# Patient Record
Sex: Male | Born: 1975 | Race: White | Hispanic: No | Smoking: Current every day smoker
Health system: Southern US, Community
[De-identification: ages and names within clinical notes are randomized; demographics above are authoritative.]

## PROBLEM LIST (undated history)

## (undated) DIAGNOSIS — J189 Pneumonia, unspecified organism: Secondary | ICD-10-CM

## (undated) DIAGNOSIS — K219 Gastro-esophageal reflux disease without esophagitis: Secondary | ICD-10-CM

## (undated) DIAGNOSIS — J449 Chronic obstructive pulmonary disease, unspecified: Secondary | ICD-10-CM

## (undated) DIAGNOSIS — S92909A Unspecified fracture of unspecified foot, initial encounter for closed fracture: Secondary | ICD-10-CM

## (undated) DIAGNOSIS — F419 Anxiety disorder, unspecified: Secondary | ICD-10-CM

## (undated) DIAGNOSIS — IMO0001 Reserved for inherently not codable concepts without codable children: Secondary | ICD-10-CM

## (undated) DIAGNOSIS — F329 Major depressive disorder, single episode, unspecified: Secondary | ICD-10-CM

## (undated) DIAGNOSIS — K859 Acute pancreatitis without necrosis or infection, unspecified: Secondary | ICD-10-CM

## (undated) DIAGNOSIS — F1020 Alcohol dependence, uncomplicated: Secondary | ICD-10-CM

## (undated) DIAGNOSIS — F32A Depression, unspecified: Secondary | ICD-10-CM

## (undated) DIAGNOSIS — I10 Essential (primary) hypertension: Secondary | ICD-10-CM

## (undated) DIAGNOSIS — S21339A Puncture wound without foreign body of unspecified front wall of thorax with penetration into thoracic cavity, initial encounter: Secondary | ICD-10-CM

## (undated) DIAGNOSIS — R0602 Shortness of breath: Secondary | ICD-10-CM

## (undated) DIAGNOSIS — R51 Headache: Secondary | ICD-10-CM

## (undated) DIAGNOSIS — M1990 Unspecified osteoarthritis, unspecified site: Secondary | ICD-10-CM

## (undated) DIAGNOSIS — W3400XA Accidental discharge from unspecified firearms or gun, initial encounter: Secondary | ICD-10-CM

## (undated) DIAGNOSIS — I209 Angina pectoris, unspecified: Secondary | ICD-10-CM

## (undated) HISTORY — PX: CORONARY ARTERY BYPASS GRAFT: SHX141

---
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
## 1999-08-28 ENCOUNTER — Emergency Department (HOSPITAL_COMMUNITY): Admission: EM | Admit: 1999-08-28 | Discharge: 1999-08-28 | Payer: Self-pay | Admitting: Emergency Medicine

## 1999-08-29 ENCOUNTER — Encounter: Payer: Self-pay | Admitting: Emergency Medicine

## 2000-07-19 DIAGNOSIS — S21339A Puncture wound without foreign body of unspecified front wall of thorax with penetration into thoracic cavity, initial encounter: Secondary | ICD-10-CM

## 2000-07-19 HISTORY — PX: MEDIASTERNOTOMY: SHX5084

## 2000-07-19 HISTORY — DX: Puncture wound without foreign body of unspecified front wall of thorax with penetration into thoracic cavity, initial encounter: S21.339A

## 2000-07-20 HISTORY — PX: AXILLARY SURGERY: SHX892

## 2000-12-24 HISTORY — PX: AMPUTATION FINGER / THUMB: SUR24

## 2001-06-05 ENCOUNTER — Emergency Department (HOSPITAL_COMMUNITY): Admission: EM | Admit: 2001-06-05 | Discharge: 2001-06-05 | Payer: Self-pay | Admitting: Emergency Medicine

## 2005-12-22 ENCOUNTER — Emergency Department (HOSPITAL_COMMUNITY): Admission: EM | Admit: 2005-12-22 | Discharge: 2005-12-23 | Payer: Self-pay | Admitting: Emergency Medicine

## 2006-07-18 ENCOUNTER — Emergency Department (HOSPITAL_COMMUNITY): Admission: EM | Admit: 2006-07-18 | Discharge: 2006-07-19 | Payer: Self-pay | Admitting: Emergency Medicine

## 2006-09-14 ENCOUNTER — Emergency Department (HOSPITAL_COMMUNITY): Admission: EM | Admit: 2006-09-14 | Discharge: 2006-09-14 | Payer: Self-pay | Admitting: Emergency Medicine

## 2006-09-16 ENCOUNTER — Emergency Department (HOSPITAL_COMMUNITY): Admission: EM | Admit: 2006-09-16 | Discharge: 2006-09-16 | Payer: Self-pay | Admitting: Emergency Medicine

## 2008-05-08 ENCOUNTER — Emergency Department (HOSPITAL_COMMUNITY): Admission: EM | Admit: 2008-05-08 | Discharge: 2008-05-09 | Payer: Self-pay | Admitting: Emergency Medicine

## 2008-08-18 ENCOUNTER — Emergency Department (HOSPITAL_COMMUNITY): Admission: EM | Admit: 2008-08-18 | Discharge: 2008-08-18 | Payer: Self-pay | Admitting: Emergency Medicine

## 2009-02-06 ENCOUNTER — Emergency Department (HOSPITAL_COMMUNITY): Admission: EM | Admit: 2009-02-06 | Discharge: 2009-02-07 | Payer: Self-pay | Admitting: Emergency Medicine

## 2010-01-08 ENCOUNTER — Emergency Department (HOSPITAL_COMMUNITY): Admission: EM | Admit: 2010-01-08 | Discharge: 2010-01-09 | Payer: Self-pay | Admitting: Emergency Medicine

## 2010-01-09 IMAGING — CT CT ANGIO CHEST
2 of 7 series · 19 of 36 positions shown · IV contrast (APPLIED)
Comparison: Chest radiograph performed [DATE]

CLINICAL DATA: Chest pain and shortness of breath; concern for
pulmonary embolus.

CT ANGIOGRAPHY CHEST WITH CONTRAST
TECHNIQUE: Multidetector CT imaging of the chest was performed
using the standard protocol during bolus administration of
intravenous contrast.  Multiplanar CT image reconstructions
including MIPs were obtained to evaluate the vascular anatomy.
Contrast:  100 mL of Omnipaque 300 IV contrast

[Series 8: pulm embolism 1.0 b25f thins · axial · 0.70mm/px · z∈[-149,+121]mm · 18 of 302 slices shown]
[im 16/302  lung]
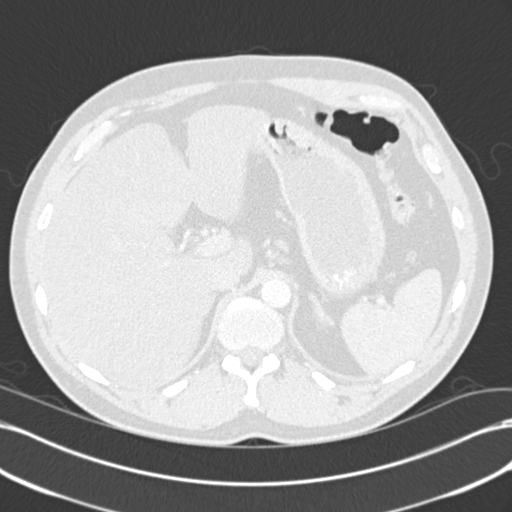
[im 32/302  mediastinal]
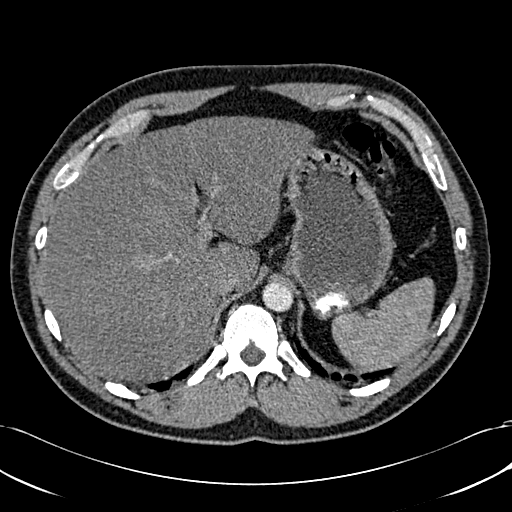
[im 48/302  lung]
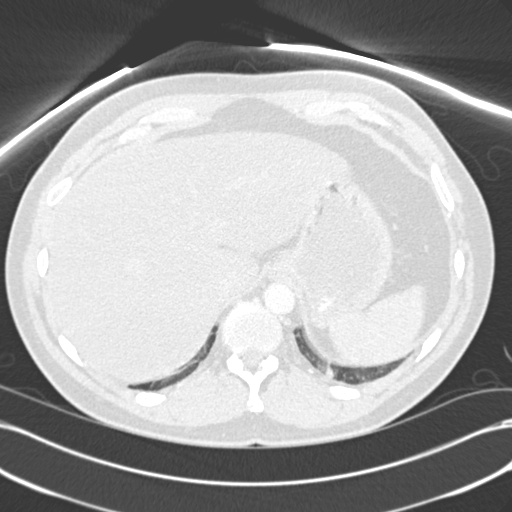
[im 64/302  mediastinal]
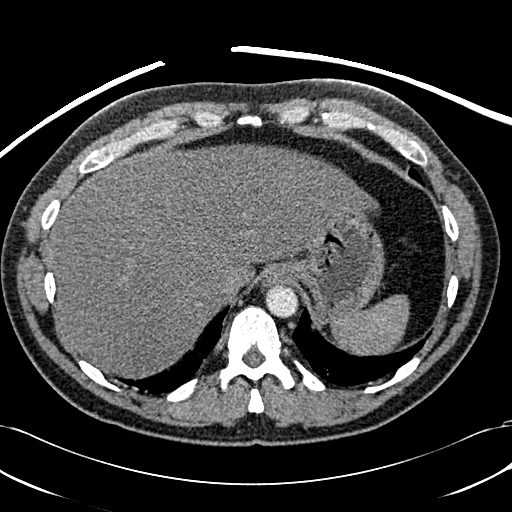
[im 80/302  lung]
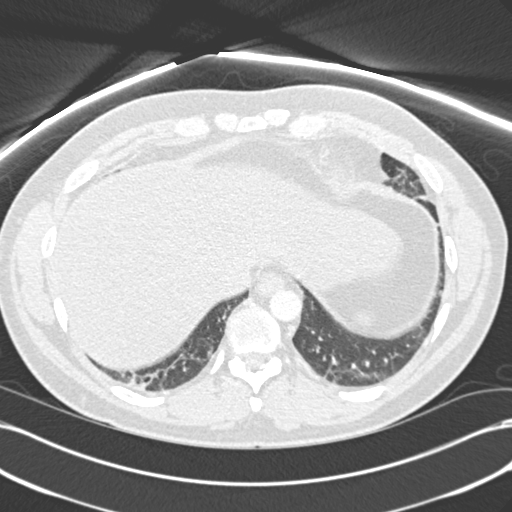
[im 96/302  mediastinal]
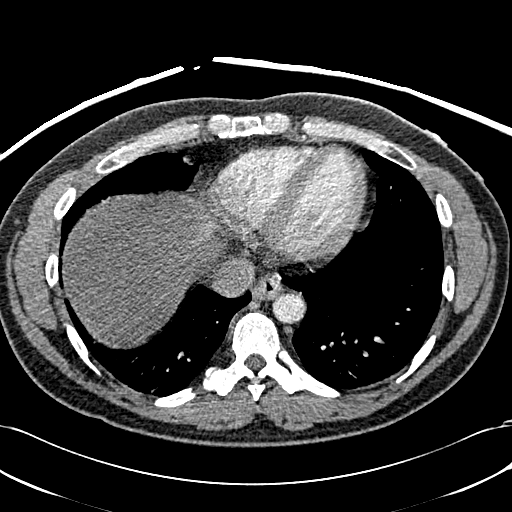
[im 111/302  lung]
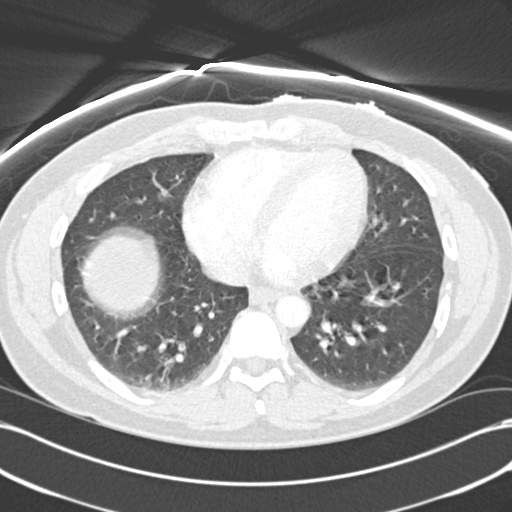
[im 127/302  mediastinal]
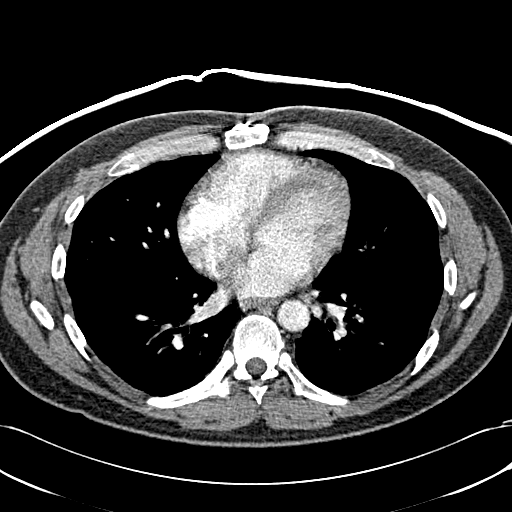
[im 143/302  lung]
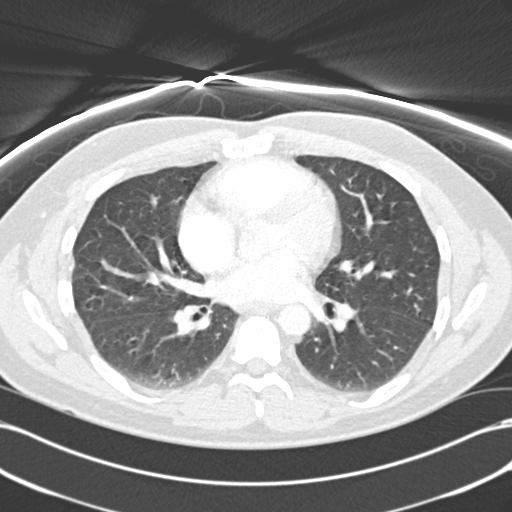
[im 159/302  mediastinal]
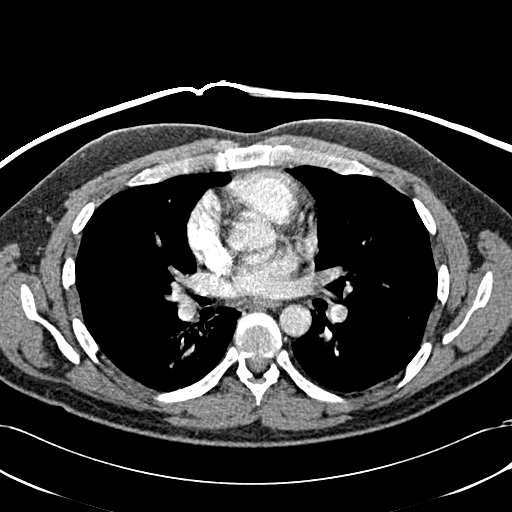
[im 175/302  lung]
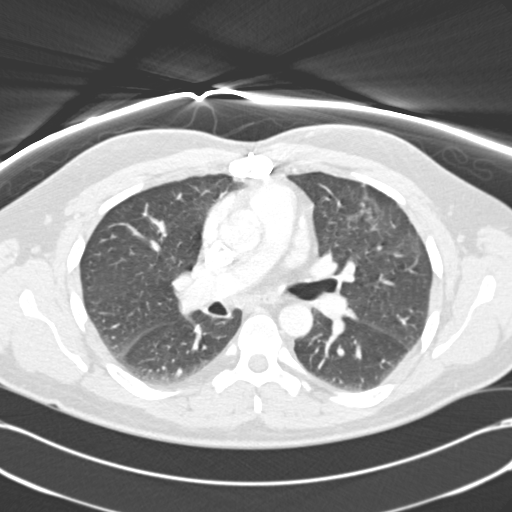
[im 191/302  mediastinal]
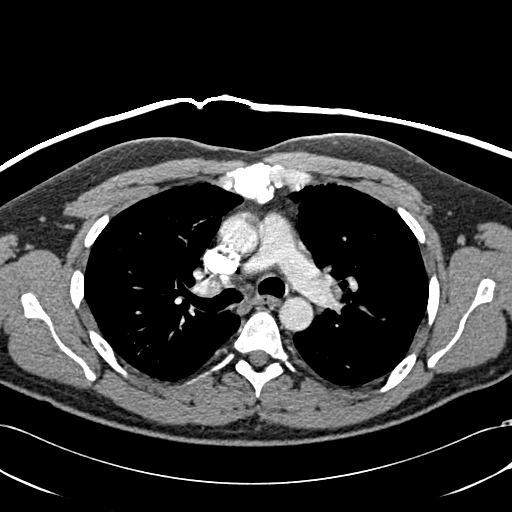
[im 206/302  lung]
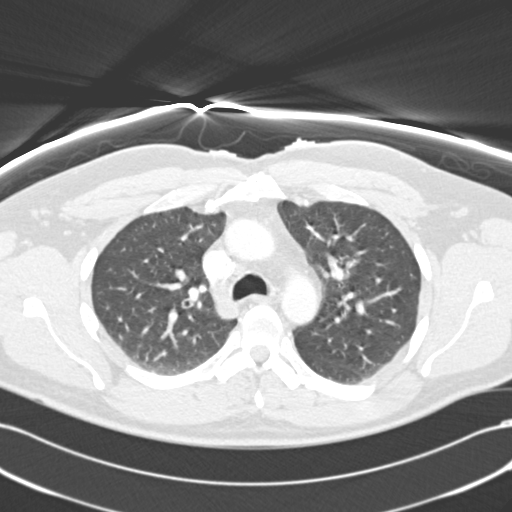
[im 222/302  mediastinal]
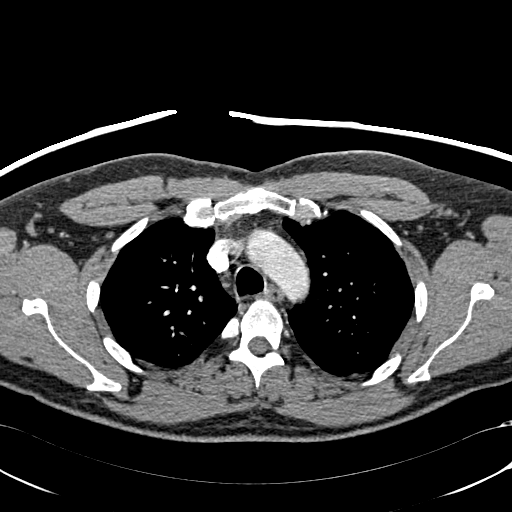
[im 238/302  lung]
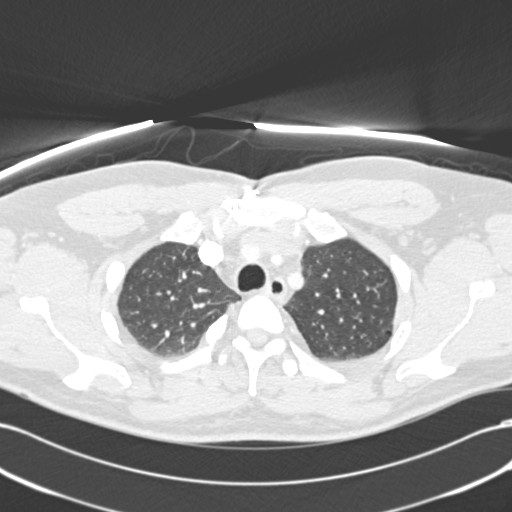
[im 254/302  mediastinal]
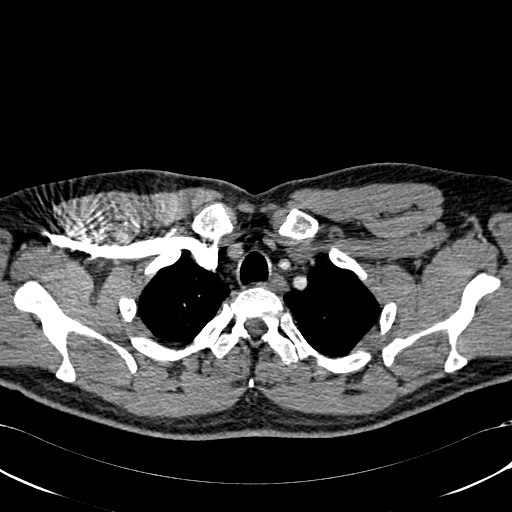
[im 270/302  lung]
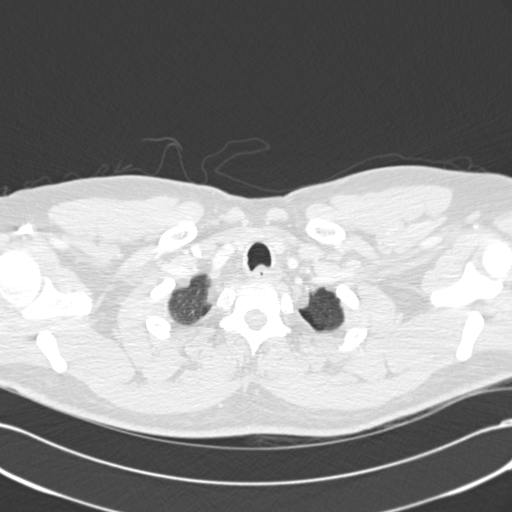
[im 286/302  mediastinal]
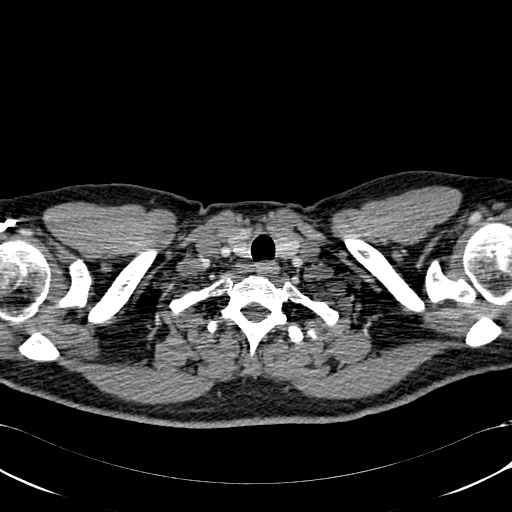

[Series 9: pulm embolism 2.0 spo thins · coronal · 0.66mm/px · 1 of 127 slices shown]
[im 64/127  mediastinal]
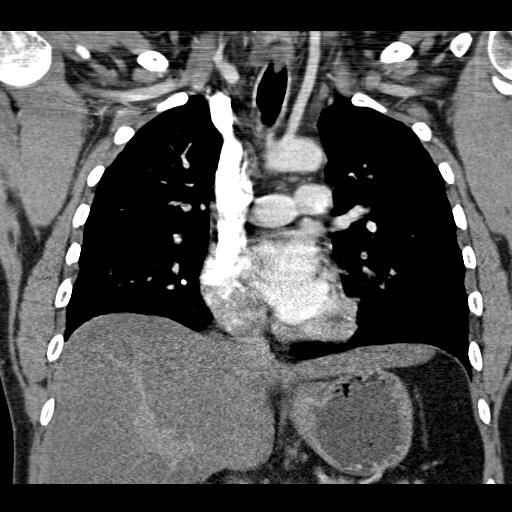

[19 of 36 positions shown; findings below may reference images not displayed]

FINDINGS: There is no evidence of pulmonary embolus, although
evaluation for pulmonary embolus is mildly suboptimal due to
limitations in the timing of the contrast bolus.

Minimal bibasilar atelectasis is noted.  There is no evidence of
significant focal consolidation, pleural effusion or pneumothorax.
No masses are identified; no abnormal focal contrast enhancement is
seen.

The mediastinum is unremarkable in appearance.  No mediastinal
lymphadenopathy is seen.  No pericardial effusion is identified.
The great vessels are within normal limits.  The patient is status
post median sternotomy.  No axillary lymphadenopathy is seen.  The
visualized portions of the thyroid gland are unremarkable in
appearance.

The visualized portions of the liver and spleen are unremarkable.

No acute osseous abnormalities are seen.

Review of the MIP images confirms the above findings.
IMPRESSION: 1.  No evidence of pulmonary embolus.
2.  Minimal bibasilar atelectasis noted.

## 2010-04-25 ENCOUNTER — Emergency Department (HOSPITAL_COMMUNITY): Admission: EM | Admit: 2010-04-25 | Discharge: 2010-04-25 | Payer: Self-pay | Admitting: Emergency Medicine

## 2010-07-23 ENCOUNTER — Inpatient Hospital Stay (HOSPITAL_COMMUNITY): Admission: EM | Admit: 2010-07-23 | Discharge: 2010-07-25 | Payer: Self-pay | Admitting: Emergency Medicine

## 2010-11-03 ENCOUNTER — Emergency Department (HOSPITAL_COMMUNITY): Admission: EM | Admit: 2010-11-03 | Discharge: 2010-11-04 | Payer: Self-pay | Admitting: Emergency Medicine

## 2011-03-06 LAB — RAPID URINE DRUG SCREEN, HOSP PERFORMED
Barbiturates: NOT DETECTED
Benzodiazepines: NOT DETECTED
Opiates: NOT DETECTED

## 2011-03-06 LAB — URINALYSIS, ROUTINE W REFLEX MICROSCOPIC
Bilirubin Urine: NEGATIVE
Glucose, UA: NEGATIVE mg/dL
Hgb urine dipstick: NEGATIVE
Ketones, ur: NEGATIVE mg/dL
Nitrite: NEGATIVE
Protein, ur: NEGATIVE mg/dL
Specific Gravity, Urine: 1.005 (ref 1.005–1.030)

## 2011-03-06 LAB — CBC
HCT: 45.5 % (ref 39.0–52.0)
Hemoglobin: 15.2 g/dL (ref 13.0–17.0)
WBC: 6.6 10*3/uL (ref 4.0–10.5)

## 2011-03-06 LAB — DIFFERENTIAL
Basophils Relative: 0 % (ref 0–1)
Eosinophils Absolute: 0.1 10*3/uL (ref 0.0–0.7)
Eosinophils Relative: 1 % (ref 0–5)
Lymphocytes Relative: 37 % (ref 12–46)
Monocytes Relative: 9 % (ref 3–12)
Neutro Abs: 3.4 10*3/uL (ref 1.7–7.7)

## 2011-03-06 LAB — COMPREHENSIVE METABOLIC PANEL
ALT: 96 U/L — ABNORMAL HIGH (ref 0–53)
AST: 72 U/L — ABNORMAL HIGH (ref 0–37)
Alkaline Phosphatase: 91 U/L (ref 39–117)
CO2: 23 mEq/L (ref 19–32)
Calcium: 8.6 mg/dL (ref 8.4–10.5)
GFR calc non Af Amer: >60 mL/min (ref >60)
Sodium: 139 mEq/L (ref 135–145)
Total Protein: 7.4 g/dL (ref 6.0–8.3)

## 2011-03-06 LAB — POCT CARDIAC MARKERS
CKMB, poc: <1 ng/mL — ABNORMAL LOW (ref 1.0–8.0)
CKMB, poc: <1 ng/mL — ABNORMAL LOW (ref 1.0–8.0)

## 2011-03-09 LAB — CBC
Hemoglobin: 15.9 g/dL (ref 13.0–17.0)
MCH: 35.4 pg — ABNORMAL HIGH (ref 26.0–34.0)
MCHC: 34.1 g/dL (ref 30.0–36.0)
MCV: 103.1 fL — ABNORMAL HIGH (ref 78.0–100.0)
Platelets: 113 10*3/uL — ABNORMAL LOW (ref 150–400)
RBC: 4.49 MIL/uL (ref 4.22–5.81)
RDW: 12.9 % (ref 11.5–15.5)
WBC: 5.5 10*3/uL (ref 4.0–10.5)

## 2011-03-09 LAB — COMPREHENSIVE METABOLIC PANEL
ALT: 69 U/L — ABNORMAL HIGH (ref 0–53)
AST: 57 U/L — ABNORMAL HIGH (ref 0–37)
BUN: 11 mg/dL (ref 6–23)
CO2: 27 mEq/L (ref 19–32)
Creatinine, Ser: 0.85 mg/dL (ref 0.4–1.5)
GFR calc non Af Amer: >60 mL/min (ref >60)
Glucose, Bld: 113 mg/dL — ABNORMAL HIGH (ref 70–99)
Sodium: 136 mEq/L (ref 135–145)
Total Bilirubin: 1 mg/dL (ref 0.3–1.2)
Total Protein: 6.7 g/dL (ref 6.0–8.3)

## 2011-03-09 LAB — VITAMIN B12: Vitamin B-12: 853 pg/mL (ref 211–911)

## 2011-03-09 LAB — LIPASE, BLOOD: Lipase: 53 U/L (ref 11–59)

## 2011-03-10 LAB — CULTURE, BLOOD (ROUTINE X 2)

## 2011-03-10 LAB — DIFFERENTIAL
Basophils Absolute: 0 10*3/uL (ref 0.0–0.1)
Eosinophils Absolute: 0.1 10*3/uL (ref 0.0–0.7)
Eosinophils Relative: 1 % (ref 0–5)
Lymphs Abs: 2.6 10*3/uL (ref 0.7–4.0)
Neutrophils Relative %: 45 % (ref 43–77)

## 2011-03-10 LAB — CK TOTAL AND CKMB (NOT AT ARMC)
CK, MB: 4.4 ng/mL — ABNORMAL HIGH (ref 0.3–4.0)
Relative Index: 1.8 (ref 0.0–2.5)
Total CK: 248 U/L — ABNORMAL HIGH (ref 7–232)

## 2011-03-10 LAB — BASIC METABOLIC PANEL
BUN: 8 mg/dL (ref 6–23)
Calcium: 8 mg/dL — ABNORMAL LOW (ref 8.4–10.5)
Chloride: 105 mEq/L (ref 96–112)
Creatinine, Ser: 0.88 mg/dL (ref 0.4–1.5)
GFR calc Af Amer: >60 mL/min (ref >60)

## 2011-03-10 LAB — CARDIAC PANEL(CRET KIN+CKTOT+MB+TROPI)
Relative Index: 2 (ref 0.0–2.5)
Total CK: 240 U/L — ABNORMAL HIGH (ref 7–232)
Troponin I: 0.01 ng/mL (ref 0.00–0.06)

## 2011-03-10 LAB — CBC
MCH: 35.8 pg — ABNORMAL HIGH (ref 26.0–34.0)
MCV: 104.5 fL — ABNORMAL HIGH (ref 78.0–100.0)
Platelets: 139 10*3/uL — ABNORMAL LOW (ref 150–400)
RBC: 4.47 MIL/uL (ref 4.22–5.81)
RDW: 13.7 % (ref 11.5–15.5)
WBC: 6.4 10*3/uL (ref 4.0–10.5)

## 2011-03-10 LAB — PHOSPHORUS: Phosphorus: 4.6 mg/dL (ref 2.3–4.6)

## 2011-03-10 LAB — LIPASE, BLOOD: Lipase: 283 U/L — ABNORMAL HIGH (ref 11–59)

## 2011-03-10 LAB — HEPATIC FUNCTION PANEL
Alkaline Phosphatase: 74 U/L (ref 39–117)
Bilirubin, Direct: <0.1 mg/dL (ref 0.0–0.3)
Total Bilirubin: 0.4 mg/dL (ref 0.3–1.2)

## 2011-03-10 LAB — TROPONIN I: Troponin I: 0.02 ng/mL (ref 0.00–0.06)

## 2011-03-10 LAB — MAGNESIUM: Magnesium: 2.2 mg/dL (ref 1.5–2.5)

## 2011-03-11 LAB — BASIC METABOLIC PANEL
CO2: 24 mEq/L (ref 19–32)
Chloride: 101 mEq/L (ref 96–112)
Creatinine, Ser: 0.87 mg/dL (ref 0.4–1.5)
GFR calc Af Amer: >60 mL/min (ref >60)
Potassium: 3.1 mEq/L — ABNORMAL LOW (ref 3.5–5.1)
Sodium: 138 mEq/L (ref 135–145)

## 2011-03-11 LAB — CBC
HCT: 46 % (ref 39.0–52.0)
Hemoglobin: 16.2 g/dL (ref 13.0–17.0)
MCV: 97.8 fL (ref 78.0–100.0)
RBC: 4.7 MIL/uL (ref 4.22–5.81)
WBC: 6.2 10*3/uL (ref 4.0–10.5)

## 2011-03-11 LAB — POCT CARDIAC MARKERS: Troponin i, poc: <0.05 ng/mL (ref 0.00–0.09)

## 2011-03-13 LAB — CBC
Hemoglobin: 16.7 g/dL (ref 13.0–17.0)
MCV: 102.9 fL — ABNORMAL HIGH (ref 78.0–100.0)
RBC: 4.55 MIL/uL (ref 4.22–5.81)
WBC: 4.2 10*3/uL (ref 4.0–10.5)

## 2011-03-13 LAB — DIFFERENTIAL
Eosinophils Absolute: 0.1 10*3/uL (ref 0.0–0.7)
Lymphs Abs: 1.5 10*3/uL (ref 0.7–4.0)
Monocytes Relative: 11 % (ref 3–12)
Neutro Abs: 2.2 10*3/uL (ref 1.7–7.7)
Neutrophils Relative %: 52 % (ref 43–77)

## 2011-03-13 LAB — RAPID URINE DRUG SCREEN, HOSP PERFORMED
Barbiturates: NOT DETECTED
Opiates: NOT DETECTED

## 2011-03-13 LAB — POCT CARDIAC MARKERS
CKMB, poc: 2 ng/mL (ref 1.0–8.0)
CKMB, poc: 2.1 ng/mL (ref 1.0–8.0)
Myoglobin, poc: 75.7 ng/mL (ref 12–200)
Myoglobin, poc: 95.2 ng/mL (ref 12–200)
Troponin i, poc: <0.05 ng/mL (ref 0.00–0.09)

## 2011-03-13 LAB — BASIC METABOLIC PANEL
Chloride: 107 mEq/L (ref 96–112)
Creatinine, Ser: 0.72 mg/dL (ref 0.4–1.5)
GFR calc Af Amer: >60 mL/min (ref >60)
Sodium: 141 mEq/L (ref 135–145)

## 2011-04-10 LAB — DIFFERENTIAL
Eosinophils Relative: 3 % (ref 0–5)
Lymphocytes Relative: 37 % (ref 12–46)
Lymphs Abs: 2.3 10*3/uL (ref 0.7–4.0)
Monocytes Relative: 11 % (ref 3–12)

## 2011-04-10 LAB — POCT CARDIAC MARKERS
CKMB, poc: <1 ng/mL — ABNORMAL LOW (ref 1.0–8.0)
Myoglobin, poc: 28.2 ng/mL (ref 12–200)
Myoglobin, poc: 32.8 ng/mL (ref 12–200)

## 2011-04-10 LAB — POCT I-STAT, CHEM 8
BUN: 16 mg/dL (ref 6–23)
Creatinine, Ser: 1.4 mg/dL (ref 0.4–1.5)
Glucose, Bld: 95 mg/dL (ref 70–99)
Potassium: 4 mEq/L (ref 3.5–5.1)
Sodium: 143 mEq/L (ref 135–145)
TCO2: 24 mmol/L (ref 0–100)

## 2011-04-10 LAB — BASIC METABOLIC PANEL
CO2: 25 mEq/L (ref 19–32)
Chloride: 105 mEq/L (ref 96–112)
GFR calc Af Amer: >60 mL/min (ref >60)
Sodium: 141 mEq/L (ref 135–145)

## 2011-04-10 LAB — CBC
RBC: 4.72 MIL/uL (ref 4.22–5.81)
WBC: 6.2 10*3/uL (ref 4.0–10.5)

## 2011-04-13 ENCOUNTER — Inpatient Hospital Stay (HOSPITAL_COMMUNITY)
Admission: EM | Admit: 2011-04-13 | Discharge: 2011-04-15 | DRG: 313 | Disposition: A | Discharge status: Home / Self Care | Payer: Medicaid Other | Attending: Internal Medicine | Admitting: Internal Medicine

## 2011-04-13 ENCOUNTER — Emergency Department (HOSPITAL_COMMUNITY): Payer: Medicaid Other

## 2011-04-13 DIAGNOSIS — F10239 Alcohol dependence with withdrawal, unspecified: Secondary | ICD-10-CM | POA: Diagnosis present

## 2011-04-13 DIAGNOSIS — R0789 Other chest pain: Principal | ICD-10-CM | POA: Diagnosis present

## 2011-04-13 DIAGNOSIS — I252 Old myocardial infarction: Secondary | ICD-10-CM

## 2011-04-13 DIAGNOSIS — F17200 Nicotine dependence, unspecified, uncomplicated: Secondary | ICD-10-CM | POA: Diagnosis present

## 2011-04-13 DIAGNOSIS — K861 Other chronic pancreatitis: Secondary | ICD-10-CM | POA: Diagnosis present

## 2011-04-13 DIAGNOSIS — Z7982 Long term (current) use of aspirin: Secondary | ICD-10-CM

## 2011-04-13 DIAGNOSIS — F10939 Alcohol use, unspecified with withdrawal, unspecified: Secondary | ICD-10-CM | POA: Diagnosis present

## 2011-04-13 DIAGNOSIS — J438 Other emphysema: Secondary | ICD-10-CM | POA: Diagnosis present

## 2011-04-13 DIAGNOSIS — I10 Essential (primary) hypertension: Secondary | ICD-10-CM | POA: Diagnosis present

## 2011-04-13 DIAGNOSIS — F10229 Alcohol dependence with intoxication, unspecified: Secondary | ICD-10-CM | POA: Diagnosis present

## 2011-04-13 DIAGNOSIS — Z8249 Family history of ischemic heart disease and other diseases of the circulatory system: Secondary | ICD-10-CM

## 2011-04-13 LAB — COMPREHENSIVE METABOLIC PANEL
Albumin: 3.7 g/dL (ref 3.5–5.2)
Alkaline Phosphatase: 96 U/L (ref 39–117)
BUN: 8 mg/dL (ref 6–23)
Chloride: 106 mEq/L (ref 96–112)
Glucose, Bld: 119 mg/dL — ABNORMAL HIGH (ref 70–99)
Potassium: 3.9 mEq/L (ref 3.5–5.1)
Total Bilirubin: 0.3 mg/dL (ref 0.3–1.2)

## 2011-04-13 LAB — CBC
MCH: 34.8 pg — ABNORMAL HIGH (ref 26.0–34.0)
MCHC: 35.1 g/dL (ref 30.0–36.0)
Platelets: 190 10*3/uL (ref 150–400)

## 2011-04-13 LAB — ETHANOL: Alcohol, Ethyl (B): 368 mg/dL — ABNORMAL HIGH (ref 0–10)

## 2011-04-13 LAB — DIFFERENTIAL
Basophils Relative: 1 % (ref 0–1)
Eosinophils Absolute: 0.1 10*3/uL (ref 0.0–0.7)
Eosinophils Relative: 1 % (ref 0–5)
Monocytes Absolute: 0.7 10*3/uL (ref 0.1–1.0)
Monocytes Relative: 10 % (ref 3–12)

## 2011-04-13 LAB — POCT CARDIAC MARKERS

## 2011-04-14 ENCOUNTER — Emergency Department (HOSPITAL_COMMUNITY): Payer: Medicaid Other

## 2011-04-14 LAB — DIFFERENTIAL
Eosinophils Absolute: 0.1 10*3/uL (ref 0.0–0.7)
Eosinophils Relative: 1 % (ref 0–5)
Lymphs Abs: 2.2 10*3/uL (ref 0.7–4.0)
Monocytes Absolute: 0.6 10*3/uL (ref 0.1–1.0)
Monocytes Relative: 9 % (ref 3–12)

## 2011-04-14 LAB — MRSA PCR SCREENING: MRSA by PCR: NEGATIVE

## 2011-04-14 LAB — CARDIAC PANEL(CRET KIN+CKTOT+MB+TROPI)
CK, MB: 3.1 ng/mL (ref 0.3–4.0)
Relative Index: 1.6 (ref 0.0–2.5)

## 2011-04-14 LAB — COMPREHENSIVE METABOLIC PANEL
ALT: 48 U/L (ref 0–53)
Albumin: 3.5 g/dL (ref 3.5–5.2)
Alkaline Phosphatase: 84 U/L (ref 39–117)
Calcium: 7.3 mg/dL — ABNORMAL LOW (ref 8.4–10.5)
GFR calc Af Amer: >60 mL/min (ref >60)
Potassium: 3.4 mEq/L — ABNORMAL LOW (ref 3.5–5.1)
Sodium: 139 mEq/L (ref 135–145)
Total Protein: 6.2 g/dL (ref 6.0–8.3)

## 2011-04-14 LAB — CBC
MCH: 33.9 pg (ref 26.0–34.0)
MCHC: 34.1 g/dL (ref 30.0–36.0)
MCV: 99.5 fL (ref 78.0–100.0)
Platelets: 161 10*3/uL (ref 150–400)
RDW: 13.2 % (ref 11.5–15.5)
WBC: 6.2 10*3/uL (ref 4.0–10.5)

## 2011-04-14 LAB — HEMOGLOBIN A1C
Hgb A1c MFr Bld: 5.7 % — ABNORMAL HIGH (ref <5.7)
Mean Plasma Glucose: 117 mg/dL — ABNORMAL HIGH (ref <117)

## 2011-04-14 LAB — RAPID URINE DRUG SCREEN, HOSP PERFORMED
Amphetamines: NOT DETECTED
Barbiturates: NOT DETECTED
Benzodiazepines: POSITIVE — AB
Cocaine: NOT DETECTED
Opiates: POSITIVE — AB
Tetrahydrocannabinol: NOT DETECTED

## 2011-04-14 LAB — LIPID PANEL
HDL: 42 mg/dL (ref >39)
Total CHOL/HDL Ratio: 3.5 RATIO
VLDL: 54 mg/dL — ABNORMAL HIGH (ref 0–40)

## 2011-04-15 LAB — CBC
HCT: 42.2 % (ref 39.0–52.0)
Hemoglobin: 14.2 g/dL (ref 13.0–17.0)
RBC: 4.28 MIL/uL (ref 4.22–5.81)
WBC: 5.9 10*3/uL (ref 4.0–10.5)

## 2011-04-15 LAB — COMPREHENSIVE METABOLIC PANEL
ALT: 40 U/L (ref 0–53)
AST: 39 U/L — ABNORMAL HIGH (ref 0–37)
Alkaline Phosphatase: 93 U/L (ref 39–117)
CO2: 27 mEq/L (ref 19–32)
Calcium: 8.1 mg/dL — ABNORMAL LOW (ref 8.4–10.5)
GFR calc Af Amer: >60 mL/min (ref >60)
GFR calc non Af Amer: >60 mL/min (ref >60)
Glucose, Bld: 87 mg/dL (ref 70–99)
Potassium: 3.7 mEq/L (ref 3.5–5.1)
Sodium: 133 mEq/L — ABNORMAL LOW (ref 135–145)
Total Protein: 6.1 g/dL (ref 6.0–8.3)

## 2011-04-15 LAB — MAGNESIUM: Magnesium: 1.7 mg/dL (ref 1.5–2.5)
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
## 2011-07-23 ENCOUNTER — Emergency Department (HOSPITAL_COMMUNITY): Payer: Medicaid Other

## 2011-07-23 ENCOUNTER — Emergency Department (HOSPITAL_COMMUNITY)
Admission: EM | Admit: 2011-07-23 | Discharge: 2011-07-23 | Discharge status: Against Medical Advice | Payer: Medicaid Other | Attending: Emergency Medicine | Admitting: Emergency Medicine

## 2011-07-23 DIAGNOSIS — R062 Wheezing: Secondary | ICD-10-CM | POA: Insufficient documentation

## 2011-07-23 DIAGNOSIS — Z79899 Other long term (current) drug therapy: Secondary | ICD-10-CM | POA: Insufficient documentation

## 2011-07-23 DIAGNOSIS — R000 Tachycardia, unspecified: Secondary | ICD-10-CM | POA: Insufficient documentation

## 2011-07-23 DIAGNOSIS — I252 Old myocardial infarction: Secondary | ICD-10-CM | POA: Insufficient documentation

## 2011-07-23 DIAGNOSIS — J438 Other emphysema: Secondary | ICD-10-CM | POA: Insufficient documentation

## 2011-07-23 DIAGNOSIS — I10 Essential (primary) hypertension: Secondary | ICD-10-CM | POA: Insufficient documentation

## 2011-07-23 DIAGNOSIS — R079 Chest pain, unspecified: Secondary | ICD-10-CM | POA: Insufficient documentation

## 2011-07-23 DIAGNOSIS — R0682 Tachypnea, not elsewhere classified: Secondary | ICD-10-CM | POA: Insufficient documentation

## 2011-07-23 DIAGNOSIS — R55 Syncope and collapse: Secondary | ICD-10-CM | POA: Insufficient documentation

## 2011-07-23 LAB — COMPREHENSIVE METABOLIC PANEL
ALT: 48 U/L (ref 0–53)
Alkaline Phosphatase: 94 U/L (ref 39–117)
BUN: 7 mg/dL (ref 6–23)
CO2: 26 mEq/L (ref 19–32)
Chloride: 102 mEq/L (ref 96–112)
GFR calc Af Amer: >60 mL/min (ref >60)
GFR calc non Af Amer: >60 mL/min (ref >60)
Glucose, Bld: 95 mg/dL (ref 70–99)
Potassium: 3.5 mEq/L (ref 3.5–5.1)
Sodium: 142 mEq/L (ref 135–145)
Total Bilirubin: 0.2 mg/dL — ABNORMAL LOW (ref 0.3–1.2)

## 2011-07-23 LAB — DIFFERENTIAL
Basophils Absolute: 0 10*3/uL (ref 0.0–0.1)
Eosinophils Relative: 2 % (ref 0–5)
Lymphocytes Relative: 33 % (ref 12–46)
Monocytes Absolute: 0.3 10*3/uL (ref 0.1–1.0)
Monocytes Relative: 6 % (ref 3–12)
Neutro Abs: 2.8 10*3/uL (ref 1.7–7.7)

## 2011-07-23 LAB — CBC
HCT: 45.4 % (ref 39.0–52.0)
Hemoglobin: 15.9 g/dL (ref 13.0–17.0)
MCH: 34.9 pg — ABNORMAL HIGH (ref 26.0–34.0)
MCHC: 35 g/dL (ref 30.0–36.0)
RDW: 12.7 % (ref 11.5–15.5)

## 2011-07-23 LAB — ETHANOL: Alcohol, Ethyl (B): 343 mg/dL — ABNORMAL HIGH (ref 0–11)

## 2011-09-06 ENCOUNTER — Emergency Department (HOSPITAL_COMMUNITY)
Admission: EM | Admit: 2011-09-06 | Discharge: 2011-09-07 | Disposition: A | Discharge status: Home / Self Care | Payer: Medicaid Other | Attending: Emergency Medicine | Admitting: Emergency Medicine

## 2011-09-06 DIAGNOSIS — M533 Sacrococcygeal disorders, not elsewhere classified: Secondary | ICD-10-CM | POA: Insufficient documentation

## 2011-09-06 DIAGNOSIS — R109 Unspecified abdominal pain: Secondary | ICD-10-CM | POA: Insufficient documentation

## 2011-09-06 DIAGNOSIS — R319 Hematuria, unspecified: Secondary | ICD-10-CM | POA: Insufficient documentation

## 2011-09-06 DIAGNOSIS — G8929 Other chronic pain: Secondary | ICD-10-CM | POA: Insufficient documentation

## 2011-09-06 DIAGNOSIS — I2510 Atherosclerotic heart disease of native coronary artery without angina pectoris: Secondary | ICD-10-CM | POA: Insufficient documentation

## 2011-09-06 DIAGNOSIS — R079 Chest pain, unspecified: Secondary | ICD-10-CM | POA: Insufficient documentation

## 2011-09-07 ENCOUNTER — Emergency Department (HOSPITAL_COMMUNITY): Payer: Medicaid Other

## 2011-09-07 LAB — BASIC METABOLIC PANEL
BUN: 6 mg/dL (ref 6–23)
Calcium: 8.8 mg/dL (ref 8.4–10.5)
Creatinine, Ser: 0.65 mg/dL (ref 0.50–1.35)
GFR calc non Af Amer: >60 mL/min (ref >60)
Glucose, Bld: 91 mg/dL (ref 70–99)
Potassium: 3.5 mEq/L (ref 3.5–5.1)

## 2011-09-07 LAB — CBC
Hemoglobin: 16.6 g/dL (ref 13.0–17.0)
MCH: 35.2 pg — ABNORMAL HIGH (ref 26.0–34.0)
MCHC: 35.3 g/dL (ref 30.0–36.0)
MCV: 99.6 fL (ref 78.0–100.0)

## 2011-09-07 LAB — URINALYSIS, ROUTINE W REFLEX MICROSCOPIC
Bilirubin Urine: NEGATIVE
Hgb urine dipstick: NEGATIVE
Ketones, ur: NEGATIVE mg/dL
Nitrite: NEGATIVE
Urobilinogen, UA: 0.2 mg/dL (ref 0.0–1.0)
pH: 6 (ref 5.0–8.0)

## 2011-09-07 LAB — DIFFERENTIAL
Basophils Relative: 0 % (ref 0–1)
Eosinophils Absolute: 0.1 10*3/uL (ref 0.0–0.7)
Eosinophils Relative: 2 % (ref 0–5)
Monocytes Relative: 13 % — ABNORMAL HIGH (ref 3–12)
Neutrophils Relative %: 55 % (ref 43–77)

## 2011-09-07 LAB — URINE MICROSCOPIC-ADD ON

## 2011-09-08 LAB — URINE CULTURE: Culture: NO GROWTH

## 2011-09-19 LAB — POCT CARDIAC MARKERS
CKMB, poc: 3.3
Myoglobin, poc: 208
Operator id: 294341
Troponin i, poc: <0.05

## 2012-03-02 ENCOUNTER — Inpatient Hospital Stay (HOSPITAL_COMMUNITY)
Admission: EM | Admit: 2012-03-02 | Discharge: 2012-03-04 | DRG: 439 | Discharge status: Against Medical Advice | Payer: Medicaid Other | Source: Ambulatory Visit | Attending: Internal Medicine | Admitting: Internal Medicine

## 2012-03-02 ENCOUNTER — Emergency Department (HOSPITAL_COMMUNITY): Payer: Medicaid Other

## 2012-03-02 ENCOUNTER — Other Ambulatory Visit: Payer: Self-pay

## 2012-03-02 ENCOUNTER — Encounter (HOSPITAL_COMMUNITY): Payer: Self-pay | Admitting: *Deleted

## 2012-03-02 DIAGNOSIS — F10229 Alcohol dependence with intoxication, unspecified: Secondary | ICD-10-CM | POA: Diagnosis present

## 2012-03-02 DIAGNOSIS — F10239 Alcohol dependence with withdrawal, unspecified: Secondary | ICD-10-CM | POA: Diagnosis present

## 2012-03-02 DIAGNOSIS — J209 Acute bronchitis, unspecified: Secondary | ICD-10-CM | POA: Diagnosis present

## 2012-03-02 DIAGNOSIS — K852 Alcohol induced acute pancreatitis without necrosis or infection: Secondary | ICD-10-CM | POA: Diagnosis present

## 2012-03-02 DIAGNOSIS — F10929 Alcohol use, unspecified with intoxication, unspecified: Secondary | ICD-10-CM | POA: Diagnosis present

## 2012-03-02 DIAGNOSIS — I10 Essential (primary) hypertension: Secondary | ICD-10-CM | POA: Diagnosis present

## 2012-03-02 DIAGNOSIS — R079 Chest pain, unspecified: Secondary | ICD-10-CM | POA: Diagnosis present

## 2012-03-02 DIAGNOSIS — F17200 Nicotine dependence, unspecified, uncomplicated: Secondary | ICD-10-CM | POA: Diagnosis present

## 2012-03-02 DIAGNOSIS — K700 Alcoholic fatty liver: Secondary | ICD-10-CM | POA: Diagnosis present

## 2012-03-02 DIAGNOSIS — K859 Acute pancreatitis without necrosis or infection, unspecified: Principal | ICD-10-CM | POA: Diagnosis present

## 2012-03-02 DIAGNOSIS — J440 Chronic obstructive pulmonary disease with acute lower respiratory infection: Secondary | ICD-10-CM | POA: Diagnosis present

## 2012-03-02 DIAGNOSIS — F10939 Alcohol use, unspecified with withdrawal, unspecified: Secondary | ICD-10-CM | POA: Diagnosis present

## 2012-03-02 DIAGNOSIS — E860 Dehydration: Secondary | ICD-10-CM | POA: Diagnosis present

## 2012-03-02 HISTORY — DX: Essential (primary) hypertension: I10

## 2012-03-02 LAB — DIFFERENTIAL
Basophils Absolute: 0 10*3/uL (ref 0.0–0.1)
Basophils Relative: 0 % (ref 0–1)
Basophils Relative: 0 % (ref 0–1)
Eosinophils Absolute: 0.1 10*3/uL (ref 0.0–0.7)
Eosinophils Absolute: 0 10*3/uL (ref 0.0–0.7)
Eosinophils Relative: 2 % (ref 0–5)
Lymphocytes Relative: 44 % (ref 12–46)
Lymphs Abs: 2 10*3/uL (ref 0.7–4.0)
Monocytes Absolute: 0.3 10*3/uL (ref 0.1–1.0)
Monocytes Relative: 7 % (ref 3–12)
Neutro Abs: 2.1 10*3/uL (ref 1.7–7.7)
Neutrophils Relative %: 47 % (ref 43–77)
Neutrophils Relative %: 67 % (ref 43–77)

## 2012-03-02 LAB — LIPASE, BLOOD: Lipase: 127 U/L — ABNORMAL HIGH (ref 11–59)

## 2012-03-02 LAB — CARDIAC PANEL(CRET KIN+CKTOT+MB+TROPI)
CK, MB: 4.2 ng/mL — ABNORMAL HIGH (ref 0.3–4.0)
Troponin I: <0.3 ng/mL (ref <0.30)
Troponin I: <0.3 ng/mL (ref <0.30)

## 2012-03-02 LAB — COMPREHENSIVE METABOLIC PANEL WITH GFR
ALT: 68 U/L — ABNORMAL HIGH (ref 0–53)
AST: 93 U/L — ABNORMAL HIGH (ref 0–37)
Albumin: 3.8 g/dL (ref 3.5–5.2)
Alkaline Phosphatase: 120 U/L — ABNORMAL HIGH (ref 39–117)
BUN: 6 mg/dL (ref 6–23)
CO2: 23 meq/L (ref 19–32)
Calcium: 8.3 mg/dL — ABNORMAL LOW (ref 8.4–10.5)
Chloride: 101 meq/L (ref 96–112)
Creatinine, Ser: 0.85 mg/dL (ref 0.50–1.35)
GFR calc Af Amer: >90 mL/min
GFR calc non Af Amer: >90 mL/min
Glucose, Bld: 98 mg/dL (ref 70–99)
Potassium: 3.7 meq/L (ref 3.5–5.1)
Sodium: 140 meq/L (ref 135–145)
Total Bilirubin: 0.3 mg/dL (ref 0.3–1.2)
Total Protein: 7.8 g/dL (ref 6.0–8.3)

## 2012-03-02 LAB — CBC
HCT: 48.1 % (ref 39.0–52.0)
HCT: 46.7 % (ref 39.0–52.0)
MCH: 33.8 pg (ref 26.0–34.0)
MCHC: 34.3 g/dL (ref 30.0–36.0)
MCV: 97 fL (ref 78.0–100.0)
RBC: 4.96 MIL/uL (ref 4.22–5.81)
RDW: 13.7 % (ref 11.5–15.5)
RDW: 13.7 % (ref 11.5–15.5)
WBC: 4.5 10*3/uL (ref 4.0–10.5)

## 2012-03-02 LAB — COMPREHENSIVE METABOLIC PANEL
CO2: 24 mEq/L (ref 19–32)
Calcium: 7.9 mg/dL — ABNORMAL LOW (ref 8.4–10.5)
Creatinine, Ser: 0.74 mg/dL (ref 0.50–1.35)
GFR calc Af Amer: >90 mL/min (ref >90)
GFR calc non Af Amer: >90 mL/min (ref >90)
Glucose, Bld: 91 mg/dL (ref 70–99)

## 2012-03-02 LAB — HEMOGLOBIN A1C
Hgb A1c MFr Bld: 5.6 % (ref <5.7)
Mean Plasma Glucose: 114 mg/dL (ref <117)

## 2012-03-02 LAB — APTT: aPTT: 31 seconds (ref 24–37)

## 2012-03-02 LAB — PHOSPHORUS: Phosphorus: 4.1 mg/dL (ref 2.3–4.6)

## 2012-03-02 LAB — RAPID URINE DRUG SCREEN, HOSP PERFORMED
Barbiturates: NOT DETECTED
Benzodiazepines: NOT DETECTED
Cocaine: NOT DETECTED
Opiates: NOT DETECTED
Tetrahydrocannabinol: NOT DETECTED

## 2012-03-02 LAB — MAGNESIUM: Magnesium: 1.8 mg/dL (ref 1.5–2.5)

## 2012-03-02 LAB — TROPONIN I: Troponin I: <0.3 ng/mL (ref <0.30)

## 2012-03-02 MED ORDER — LORAZEPAM 2 MG/ML IJ SOLN: 1.0000 mg | INTRAMUSCULAR | Status: DC

## 2012-03-02 MED ORDER — ONDANSETRON HCL 4 MG/2ML IJ SOLN
4.0000 mg | Freq: Once | INTRAMUSCULAR | Status: AC
  Administered 2012-03-02: 4 mg via INTRAVENOUS

## 2012-03-02 MED ORDER — PANTOPRAZOLE SODIUM 40 MG IV SOLR
40.0000 mg | INTRAVENOUS | Status: DC
  Administered 2012-03-02 – 2012-03-04 (×3): 40 mg via INTRAVENOUS
  Filled 2012-03-02 (×3): qty 40

## 2012-03-02 MED ORDER — LORAZEPAM 2 MG/ML IJ SOLN: 1.0000 mg | INTRAMUSCULAR | Status: DC | PRN

## 2012-03-02 MED ORDER — MORPHINE SULFATE 2 MG/ML IJ SOLN
1.0000 mg | INTRAMUSCULAR | Status: DC | PRN
  Administered 2012-03-02: 1 mg via INTRAVENOUS
  Filled 2012-03-02: qty 1

## 2012-03-02 MED ORDER — THIAMINE HCL 100 MG/ML IJ SOLN
100.0000 mg | Freq: Every day | INTRAMUSCULAR | Status: DC
  Administered 2012-03-03 – 2012-03-04 (×2): 100 mg via INTRAVENOUS
  Filled 2012-03-02 (×3): qty 1

## 2012-03-02 MED ORDER — SODIUM CHLORIDE 0.9 % IV SOLN
Freq: Once | INTRAVENOUS | Status: AC
  Administered 2012-03-02: 01:00:00 via INTRAVENOUS

## 2012-03-02 MED ORDER — LORAZEPAM 2 MG/ML IJ SOLN
INTRAMUSCULAR | Status: AC
  Filled 2012-03-02: qty 1

## 2012-03-02 MED ORDER — VITAMIN B-1 100 MG PO TABS
100.0000 mg | ORAL_TABLET | Freq: Every day | ORAL | Status: DC
  Administered 2012-03-02: 100 mg via ORAL
  Filled 2012-03-02 (×2): qty 1

## 2012-03-02 MED ORDER — HYDRALAZINE HCL 20 MG/ML IJ SOLN
10.0000 mg | Freq: Four times a day (QID) | INTRAMUSCULAR | Status: DC | PRN
  Filled 2012-03-02: qty 0.5

## 2012-03-02 MED ORDER — ONDANSETRON HCL 4 MG/2ML IJ SOLN
4.0000 mg | Freq: Four times a day (QID) | INTRAMUSCULAR | Status: DC | PRN
  Administered 2012-03-02: 4 mg via INTRAVENOUS
  Filled 2012-03-02: qty 2

## 2012-03-02 MED ORDER — SODIUM CHLORIDE 0.9 % IJ SOLN
3.0000 mL | Freq: Two times a day (BID) | INTRAMUSCULAR | Status: DC
  Administered 2012-03-02 – 2012-03-03 (×3): 3 mL via INTRAVENOUS

## 2012-03-02 MED ORDER — ALBUTEROL SULFATE (5 MG/ML) 0.5% IN NEBU
5.0000 mg | INHALATION_SOLUTION | Freq: Once | RESPIRATORY_TRACT | Status: AC
  Administered 2012-03-02: 5 mg via RESPIRATORY_TRACT
  Filled 2012-03-02: qty 1

## 2012-03-02 MED ORDER — ONDANSETRON HCL 4 MG PO TABS: 4.0000 mg | ORAL_TABLET | Freq: Four times a day (QID) | ORAL | Status: DC | PRN

## 2012-03-02 MED ORDER — HYDROMORPHONE HCL PF 1 MG/ML IJ SOLN
1.0000 mg | Freq: Once | INTRAMUSCULAR | Status: AC
  Administered 2012-03-02: 1 mg via INTRAVENOUS
  Filled 2012-03-02: qty 1

## 2012-03-02 MED ORDER — FOLIC ACID 1 MG PO TABS
1.0000 mg | ORAL_TABLET | Freq: Every day | ORAL | Status: DC
  Administered 2012-03-02: 1 mg via ORAL
  Filled 2012-03-02 (×2): qty 1

## 2012-03-02 MED ORDER — MORPHINE SULFATE 2 MG/ML IJ SOLN
2.0000 mg | Freq: Once | INTRAMUSCULAR | Status: AC
  Administered 2012-03-02: 2 mg via INTRAVENOUS
  Filled 2012-03-02: qty 1

## 2012-03-02 MED ORDER — ALBUTEROL SULFATE (5 MG/ML) 0.5% IN NEBU
INHALATION_SOLUTION | RESPIRATORY_TRACT | Status: AC
  Filled 2012-03-02: qty 1

## 2012-03-02 MED ORDER — ADULT MULTIVITAMIN W/MINERALS CH
1.0000 | ORAL_TABLET | Freq: Every day | ORAL | Status: DC
  Administered 2012-03-02 – 2012-03-04 (×3): 1 via ORAL
  Filled 2012-03-02 (×3): qty 1

## 2012-03-02 MED ORDER — LORAZEPAM 2 MG/ML IJ SOLN
INTRAMUSCULAR | Status: AC
  Administered 2012-03-02: 1 mg via INTRAVENOUS
  Filled 2012-03-02: qty 1

## 2012-03-02 MED ORDER — MORPHINE SULFATE 2 MG/ML IJ SOLN
1.0000 mg | Freq: Four times a day (QID) | INTRAMUSCULAR | Status: DC | PRN
  Administered 2012-03-02: 1 mg via INTRAVENOUS
  Filled 2012-03-02: qty 1

## 2012-03-02 MED ORDER — LORAZEPAM 2 MG/ML IJ SOLN
2.0000 mg | Freq: Once | INTRAMUSCULAR | Status: AC
  Administered 2012-03-02: 2 mg via INTRAVENOUS
  Filled 2012-03-02: qty 1

## 2012-03-02 MED ORDER — MORPHINE SULFATE 4 MG/ML IJ SOLN
2.0000 mg | Freq: Once | INTRAMUSCULAR | Status: AC
  Administered 2012-03-02: 2 mg via INTRAVENOUS
  Filled 2012-03-02: qty 1

## 2012-03-02 MED ORDER — SODIUM CHLORIDE 0.9 % IV SOLN
INTRAVENOUS | Status: DC
  Administered 2012-03-02: 125 mL/h via INTRAVENOUS
  Administered 2012-03-02 – 2012-03-03 (×2): via INTRAVENOUS

## 2012-03-02 MED ORDER — LORAZEPAM 2 MG/ML IJ SOLN
1.0000 mg | Freq: Four times a day (QID) | INTRAMUSCULAR | Status: DC | PRN
  Administered 2012-03-02 (×3): 1 mg via INTRAVENOUS
  Filled 2012-03-02: qty 1

## 2012-03-02 MED ORDER — ENOXAPARIN SODIUM 40 MG/0.4ML ~~LOC~~ SOLN
40.0000 mg | SUBCUTANEOUS | Status: DC
  Administered 2012-03-02: 40 mg via SUBCUTANEOUS
  Filled 2012-03-02 (×2): qty 0.4

## 2012-03-02 MED ORDER — ENOXAPARIN SODIUM 30 MG/0.3ML ~~LOC~~ SOLN: 30.0000 mg | SUBCUTANEOUS | Status: DC

## 2012-03-02 MED ORDER — LORAZEPAM 0.5 MG PO TABS: 1.0000 mg | ORAL_TABLET | Freq: Four times a day (QID) | ORAL | Status: DC | PRN

## 2012-03-02 MED ORDER — LORAZEPAM 2 MG/ML IJ SOLN
1.0000 mg | INTRAMUSCULAR | Status: DC | PRN
  Administered 2012-03-02 – 2012-03-03 (×3): 1 mg via INTRAVENOUS
  Filled 2012-03-02 (×4): qty 1

## 2012-03-02 MED ORDER — ALBUTEROL SULFATE (5 MG/ML) 0.5% IN NEBU: 2.5000 mg | INHALATION_SOLUTION | RESPIRATORY_TRACT | Status: DC | PRN

## 2012-03-02 MED ORDER — ONDANSETRON HCL 4 MG/2ML IJ SOLN
INTRAMUSCULAR | Status: AC
  Filled 2012-03-02: qty 2

## 2012-03-02 NOTE — ED Notes (Signed)
Report given to Tobi Bastos, RN on 2600. Patient being placed on Zoll and being transported to 2602.

## 2012-03-02 NOTE — ED Notes (Signed)
The patient states he started feeling chest tightness and shortness of breath at 0100 on 02/28/12.  He states the pain gets worse with movement.  The patient states he is a heavy drinker and that he hasn't had any alcohol since 1600 yesterday, so he feels like he is going through withdrawals.

## 2012-03-02 NOTE — H&P (Signed)
PCP:  [name], MD, MD   DOA:  03/02/2012 12:29 AM  Chief Complaint:  Alcohol intoxiacation  HPI: 36 year old male with past medical history of alcohol abuse, hypertension who presented to emergency department with complaints of retrosternal chest pain, 8/10 in intensity, constant, nonradiating, pressure like, started at rest. There was no associated cough or shortness of breath or palpitations. Patient reports abusing alcohol on daily basis. Last drink was one day prior to admission, vodka and beer. He reports feeling very restless and shaking. There are no reports of fever or chills. He does report having a periumbilical pain, 5-6/10, non radiating, burning, constant pain associated with nausea but no vomiting. No reports of blood in stool or urine. No complaints of diarrhea or constipation. No lightheadedness or dizziness.  Assessment/Plan  Principal Problem:   *Acute alcohol intoxication - alcohol level of 417 - start CIWA protocol - obtain UDS - monitor for withdrawals  Active Problems:   Pancreatitis, alcoholic, acute - continue IV fluids, NS @ 125 cc/hr - keep NPO - zofran Q $ hours PRN nausea and vomiting  Chest pain - rule out ACS/MI - the first set of troponins is negative - cycle cardiac enzymes for total of 3 sets - lipid panel, TSH and A1c follow up  Disposition - to stepdown for first 24 hours    Allergies: No Known Allergies  Prior to Admission medications   Medication Sig Start Date End Date Taking? Authorizing Provider  lisinopril (PRINIVIL,ZESTRIL) 20 MG tablet Take 20 mg by mouth daily.   Yes Historical Provider, MD    No past medical history on file.  No past surgical history on file.  Social History:  does not have a smoking history on file. He does not have any smokeless tobacco history on file. His alcohol and drug histories not on file.  No family history on file.  Review of Systems:  Constitutional: Denies fever, chills,  diaphoresis, appetite change and fatigue.  HEENT: Denies photophobia, eye pain, redness, hearing loss, ear pain, congestion, sore throat, rhinorrhea, sneezing, mouth sores, trouble swallowing, neck pain, neck stiffness and tinnitus.   Respiratory: Denies SOB, DOE, cough, chest tightness,  and wheezing.   Cardiovascular: (+) chest pain, (-) palpitations and leg swelling.  Gastrointestinal: (+) nausea, (-) vomiting, abdominal pain, diarrhea, constipation, blood in stool and abdominal distention.  Genitourinary: Denies dysuria, urgency, frequency, hematuria, flank pain and difficulty urinating.  Musculoskeletal: Denies myalgias, back pain, joint swelling, arthralgias and gait problem.  Skin: Denies pallor, rash and wound.  Neurological: as per HPI  Hematological: Denies adenopathy. Easy bruising, personal or family bleeding history  Psychiatric/Behavioral: Denies suicidal ideation, mood changes, confusion, nervousness, sleep disturbance and agitation   Physical Exam:  Filed Vitals:   03/02/12 0415 03/02/12 0500 03/02/12 0545 03/02/12 0642  BP: 137/84 145/81 137/91 150/90  Pulse: 100 109 111 103  Resp: 15 14 14 12   SpO2: 96% 90% 93% 95%    Constitutional: Vital signs reviewed.  Patient is cooperative with exam. Alert and oriented x3; restless Head: Normocephalic and atraumatic Ear: TM normal bilaterally Mouth: no erythema or exudates, MMM Eyes: PERRL, EOMI, conjunctivae normal, No scleral icterus.  Neck: Supple, Trachea midline normal ROM, No JVD, mass, thyromegaly, or carotid bruit present.  Cardiovascular: RRR, S1 normal, S2 normal, no MRG, pulses symmetric and intact bilaterally Pulmonary/Chest: CTAB, no wheezes, rales, or rhonchi Abdominal: Soft. Non-tender, non-distended, bowel sounds are normal, no masses, organomegaly, or guarding present.  GU: no CVA tenderness Musculoskeletal:  No joint deformities, erythema, or stiffness, ROM full and no nontender Ext: no edema and no cyanosis,  pulses palpable bilaterally (DP and PT) Hematology: no cervical, inginal, or axillary adenopathy.  Neurological: A&O x3, Strenght is normal and symmetric bilaterally, cranial nerve II-XII are grossly intact, no focal motor deficit, sensory intact to light touch bilaterally.  Skin: Warm, dry and intact. No rash, cyanosis, or clubbing.  Psychiatric: Normal mood and affect. speech and behavior is normal. J  Labs on Admission:  Results for orders placed during the hospital encounter of 03/02/12 (from the past 48 hour(s))  URINE RAPID DRUG SCREEN (HOSP PERFORMED)     Status: Normal   Collection Time   03/02/12  1:10 AM      Component Value Range Comment   Opiates NONE DETECTED  NONE DETECTED     Cocaine NONE DETECTED  NONE DETECTED     Benzodiazepines NONE DETECTED  NONE DETECTED     Amphetamines NONE DETECTED  NONE DETECTED     Tetrahydrocannabinol NONE DETECTED  NONE DETECTED     Barbiturates NONE DETECTED  NONE DETECTED    LIPASE, BLOOD     Status: Abnormal   Collection Time   03/02/12  1:25 AM      Component Value Range Comment   Lipase 127 (*) 11 - 59 (U/L)   COMPREHENSIVE METABOLIC PANEL     Status: Abnormal   Collection Time   03/02/12  1:25 AM      Component Value Range Comment   Sodium 140  135 - 145 (mEq/L)    Potassium 3.7  3.5 - 5.1 (mEq/L)    Chloride 101  96 - 112 (mEq/L)    CO2 23  19 - 32 (mEq/L)    Glucose, Bld 98  70 - 99 (mg/dL)    BUN 6  6 - 23 (mg/dL)    Creatinine, Ser 1.61  0.50 - 1.35 (mg/dL)    Calcium 8.3 (*) 8.4 - 10.5 (mg/dL)    Total Protein 7.8  6.0 - 8.3 (g/dL)    Albumin 3.8  3.5 - 5.2 (g/dL)    AST 93 (*) 0 - 37 (U/L)    ALT 68 (*) 0 - 53 (U/L)    Alkaline Phosphatase 120 (*) 39 - 117 (U/L)    Total Bilirubin 0.3  0.3 - 1.2 (mg/dL)    GFR calc non Af Amer >90  >90 (mL/min)    GFR calc Af Amer >90  >90 (mL/min)   CBC     Status: Abnormal   Collection Time   03/02/12  1:25 AM      Component Value Range Comment   WBC 4.5  4.0 - 10.5 (K/uL)    RBC  4.96  4.22 - 5.81 (MIL/uL)    Hemoglobin 17.3 (*) 13.0 - 17.0 (g/dL)    HCT 09.6  04.5 - 40.9 (%)    MCV 97.0  78.0 - 100.0 (fL)    MCH 34.9 (*) 26.0 - 34.0 (pg)    MCHC 36.0  30.0 - 36.0 (g/dL)    RDW 81.1  91.4 - 78.2 (%)    Platelets 128 (*) 150 - 400 (K/uL)   DIFFERENTIAL     Status: Normal   Collection Time   03/02/12  1:25 AM      Component Value Range Comment   Neutrophils Relative 47  43 - 77 (%)    Neutro Abs 2.1  1.7 - 7.7 (K/uL)    Lymphocytes Relative 44  12 - 46 (%)    Lymphs Abs 2.0  0.7 - 4.0 (K/uL)    Monocytes Relative 7  3 - 12 (%)    Monocytes Absolute 0.3  0.1 - 1.0 (K/uL)    Eosinophils Relative 2  0 - 5 (%)    Eosinophils Absolute 0.1  0.0 - 0.7 (K/uL)    Basophils Relative 0  0 - 1 (%)    Basophils Absolute 0.0  0.0 - 0.1 (K/uL)   TROPONIN I     Status: Normal   Collection Time   03/02/12  1:25 AM      Component Value Range Comment   Troponin I <0.30  <0.30 (ng/mL)   ETHANOL     Status: Abnormal   Collection Time   03/02/12  1:25 AM      Component Value Range Comment   Alcohol, Ethyl (B) 417 (*) 0 - 11 (mg/dL)     Time Spent on Admission: Greater than 30 minutes  Viola Kinnick 03/02/2012, 7:49 AM

## 2012-03-02 NOTE — ED Notes (Signed)
RT notified for tx

## 2012-03-02 NOTE — ED Notes (Addendum)
First meeting with patient. Patient remains on monitor and is beginning to get agitated. Patient states he had his last drink of alcohol at 4pm yesterday and he does not want to drink anymore. Patient denies chest pain and nausea.

## 2012-03-02 NOTE — ED Notes (Signed)
Patient sleeping wit NAD at this time.
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
## 2012-03-03 DIAGNOSIS — J209 Acute bronchitis, unspecified: Secondary | ICD-10-CM | POA: Diagnosis present

## 2012-03-03 DIAGNOSIS — F10239 Alcohol dependence with withdrawal, unspecified: Secondary | ICD-10-CM | POA: Diagnosis present

## 2012-03-03 DIAGNOSIS — E860 Dehydration: Secondary | ICD-10-CM | POA: Diagnosis present

## 2012-03-03 LAB — COMPREHENSIVE METABOLIC PANEL
ALT: 60 U/L — ABNORMAL HIGH (ref 0–53)
AST: 72 U/L — ABNORMAL HIGH (ref 0–37)
Albumin: 3.3 g/dL — ABNORMAL LOW (ref 3.5–5.2)
Alkaline Phosphatase: 105 U/L (ref 39–117)
BUN: 7 mg/dL (ref 6–23)
Chloride: 98 mEq/L (ref 96–112)
Potassium: 4.1 mEq/L (ref 3.5–5.1)
Total Bilirubin: 0.6 mg/dL (ref 0.3–1.2)

## 2012-03-03 LAB — CBC
MCH: 34.3 pg — ABNORMAL HIGH (ref 26.0–34.0)
MCHC: 34.5 g/dL (ref 30.0–36.0)
MCV: 99.6 fL (ref 78.0–100.0)
Platelets: 85 10*3/uL — ABNORMAL LOW (ref 150–400)
RDW: 13.7 % (ref 11.5–15.5)
WBC: 4 10*3/uL (ref 4.0–10.5)

## 2012-03-03 LAB — GLUCOSE, CAPILLARY: Glucose-Capillary: 176 mg/dL — ABNORMAL HIGH (ref 70–99)

## 2012-03-03 LAB — CARDIAC PANEL(CRET KIN+CKTOT+MB+TROPI): Troponin I: <0.3 ng/mL (ref <0.30)

## 2012-03-03 LAB — TSH: TSH: 4.06 u[IU]/mL (ref 0.350–4.500)

## 2012-03-03 MED ORDER — SODIUM CHLORIDE 0.9 % IV SOLN: 1.0000 mg | Freq: Every day | INTRAVENOUS | Status: DC

## 2012-03-03 MED ORDER — LEVOFLOXACIN IN D5W 750 MG/150ML IV SOLN
750.0000 mg | INTRAVENOUS | Status: DC
  Administered 2012-03-03 – 2012-03-04 (×2): 750 mg via INTRAVENOUS
  Filled 2012-03-03 (×2): qty 150

## 2012-03-03 MED ORDER — LEVALBUTEROL HCL 0.63 MG/3ML IN NEBU
0.6300 mg | INHALATION_SOLUTION | Freq: Four times a day (QID) | RESPIRATORY_TRACT | Status: DC
  Administered 2012-03-03 – 2012-03-04 (×4): 0.63 mg via RESPIRATORY_TRACT
  Filled 2012-03-03 (×8): qty 3

## 2012-03-03 MED ORDER — METHYLPREDNISOLONE SODIUM SUCC 125 MG IJ SOLR
125.0000 mg | Freq: Three times a day (TID) | INTRAMUSCULAR | Status: AC
  Administered 2012-03-03 – 2012-03-04 (×3): 125 mg via INTRAVENOUS
  Filled 2012-03-03 (×3): qty 2

## 2012-03-03 MED ORDER — KCL IN DEXTROSE-NACL 20-5-0.9 MEQ/L-%-% IV SOLN
INTRAVENOUS | Status: DC
  Administered 2012-03-03 – 2012-03-04 (×3): via INTRAVENOUS
  Filled 2012-03-03 (×5): qty 1000

## 2012-03-03 MED ORDER — LORAZEPAM 0.5 MG PO TABS: 1.0000 mg | ORAL_TABLET | ORAL | Status: DC | PRN

## 2012-03-03 MED ORDER — MORPHINE SULFATE 2 MG/ML IJ SOLN: 1.0000 mg | INTRAMUSCULAR | Status: DC | PRN

## 2012-03-03 MED ORDER — NICOTINE 21 MG/24HR TD PT24
21.0000 mg | MEDICATED_PATCH | TRANSDERMAL | Status: DC
  Administered 2012-03-03: 21 mg via TRANSDERMAL
  Filled 2012-03-03 (×2): qty 1

## 2012-03-03 MED ORDER — INSULIN ASPART 100 UNIT/ML ~~LOC~~ SOLN
0.0000 [IU] | Freq: Three times a day (TID) | SUBCUTANEOUS | Status: DC
  Administered 2012-03-03: 3 [IU] via SUBCUTANEOUS
  Administered 2012-03-04: 2 [IU] via SUBCUTANEOUS

## 2012-03-03 MED ORDER — FOLIC ACID 5 MG/ML IJ SOLN
1.0000 mg | Freq: Every day | INTRAMUSCULAR | Status: DC
  Administered 2012-03-03 – 2012-03-04 (×2): 1 mg via INTRAVENOUS
  Filled 2012-03-03 (×2): qty 0.2

## 2012-03-03 MED ORDER — LEVALBUTEROL HCL 0.63 MG/3ML IN NEBU
0.6300 mg | INHALATION_SOLUTION | RESPIRATORY_TRACT | Status: DC | PRN
  Filled 2012-03-03: qty 3

## 2012-03-03 MED ORDER — HYDRALAZINE HCL 20 MG/ML IJ SOLN
10.0000 mg | Freq: Four times a day (QID) | INTRAMUSCULAR | Status: DC | PRN
  Filled 2012-03-03: qty 0.5

## 2012-03-03 MED ORDER — LORAZEPAM 2 MG/ML IJ SOLN
1.0000 mg | INTRAMUSCULAR | Status: DC | PRN
  Administered 2012-03-03 – 2012-03-04 (×6): 1 mg via INTRAVENOUS
  Filled 2012-03-03 (×6): qty 1

## 2012-03-03 MED ORDER — PREDNISONE 50 MG PO TABS
60.0000 mg | ORAL_TABLET | Freq: Every day | ORAL | Status: DC
  Filled 2012-03-03 (×2): qty 1

## 2012-03-03 MED ORDER — GUAIFENESIN ER 600 MG PO TB12
1200.0000 mg | ORAL_TABLET | Freq: Two times a day (BID) | ORAL | Status: DC | PRN
  Filled 2012-03-03: qty 2

## 2012-03-03 NOTE — Progress Notes (Signed)
Clinical Child psychotherapist discussed with pt MD regarding pt current substance abuse. Per discussion, MD advised waiting to discuss further substance abuse treatment options with patient once pt is not experiencing active dt's. .Clinical social worker continuing to follow pt to assist with pt dc plans and further csw needs.   Catha Gosselin, Theresia Majors  [phone] .03/03/2012 14:23pm

## 2012-03-03 NOTE — Progress Notes (Signed)
[facility]  Subjective: Endorses thirst but nearly resolved abdominal pain. Endorsed had cough before admission. MD discussed with patient need to stop ETOH then tobacco. Patient verbalizes understanding of rationale for no solid food. Endorses "shakiness."  Objective: Vital signs in last 24 hours: Temp:  [97.6 F (36.4 C)-98.9 F (37.2 C)] 98.1 F (36.7 C) (03/11 1100) Pulse Rate:  [77-111] 77  (03/11 1100) Resp:  [12-20] 12  (03/11 1100) BP: (129-169)/(82-124) 136/90 mmHg (03/11 1100) SpO2:  [92 %-97 %] 97 % (03/11 1100) FiO2 (%):  [3 %] 3 % (03/10 1600) Weight:  [95.1 kg (209 lb 10.5 oz)] 95.1 kg (209 lb 10.5 oz) (03/11 0500) Weight change: -33.307 kg (-73 lb 6.9 oz) Last BM Date: 03/01/12  Intake/Output from previous day: 03/10 0701 - 03/11 0700 In: 917.6 [I.V.:917.6] Out: 2050 [Urine:1825; Emesis/NG output:225] Intake/Output this shift: Total I/O In: 525 [I.V.:375; IV Piggyback:150] Out: 700 [Urine:700]  General appearance: alert, cooperative, appears stated age and mild distress Resp: expiratory wheezes bilaterally, 2 liters oxygen, productive cough observed Cardio: regular rate and rhythm, S1, S2 normal, no murmur, click, rub or gallop GI: soft, non-tender; bowel sounds normal; no masses,  no organomegaly Extremities: extremities normal, atraumatic, no cyanosis or edema Neurologic: Grossly normal, no tremors observed, no psycho-motor agitation  Lab Results:  [facility] 03/03/12 0204 03/02/12 0950  WBC 4.0 5.1  HGB 15.4 16.0  HCT 44.7 46.7  PLT 85* 112*   BMET  Basename 03/03/12 0204 03/02/12 0950  NA 136 140  K 4.1 3.6  CL 98 102  CO2 26 24  GLUCOSE 69* 91  BUN 7 7  CREATININE 0.69 0.74  CALCIUM 8.7 7.9*    Studies/Results: Dg Chest Port 1 View  03/02/2012  *RADIOLOGY REPORT*  Clinical Data: Chest pain, EtOH  PORTABLE CHEST - 1 VIEW  Comparison: 09/07/2011  Findings: Heart size within normal limits.  Status post median sternotomy.  Blunted right  costophrenic angle may reflect scarring or a trace pleural effusion.  No pneumothorax.  No new areas of consolidation.  No acute osseous abnormality.  IMPRESSION: No focal consolidation.  Original Report Authenticated By: Waneta Martins, M.D.   Medications:  I have reviewed the patient's current medications.  Assessment/Plan:  Acute alcohol intoxication/ Alcohol withdrawal syndrome * Blood alcohol level >400 at admission * Continue CIWA protocol q 4 hours   Chest pain * No further chest pain * Enzymes and EKG negative * sx are actually more c/w epigastric pain   Pancreatitis, alcoholic, acute *Lipase mildly elevated- repeat in am *CT abdomen/pelvis shows no evidence severe pancreatitis or pseudocyst *Allow clears only  Bronchitis, acute, with bronchospasm * Begin Levaquin for short course * Add nebs and IV steroids on rapid taper   Dehydration * Continue IVF but add dextrose for hypoglycemia   HTN (hypertension) * Adjust IV hydralazine  Tobacco abuse * Nicotine patch   Disposition * Remain in stepdown   LOS: 1 day   Junious Silk, ANP pager [phone]  [facility] 1 [email or web address] Password: [email or web address]  03/03/2012, 1:04 PM  Lonia Blood, MD [facility] Office  [phone] Pager [phone]  On-Call/Text Page:      [email or web address]      password [facility]

## 2012-03-04 LAB — GLUCOSE, CAPILLARY
Glucose-Capillary: 165 mg/dL — ABNORMAL HIGH (ref 70–99)
Glucose-Capillary: 168 mg/dL — ABNORMAL HIGH (ref 70–99)
Glucose-Capillary: 195 mg/dL — ABNORMAL HIGH (ref 70–99)

## 2012-03-04 LAB — COMPREHENSIVE METABOLIC PANEL
ALT: 53 U/L (ref 0–53)
AST: 57 U/L — ABNORMAL HIGH (ref 0–37)
Alkaline Phosphatase: 111 U/L (ref 39–117)
CO2: 24 mEq/L (ref 19–32)
Calcium: 9.2 mg/dL (ref 8.4–10.5)
Chloride: 97 mEq/L (ref 96–112)
GFR calc non Af Amer: >90 mL/min (ref >90)
Potassium: 4.1 mEq/L (ref 3.5–5.1)
Sodium: 130 mEq/L — ABNORMAL LOW (ref 135–145)
Total Bilirubin: 0.4 mg/dL (ref 0.3–1.2)

## 2012-03-04 LAB — MAGNESIUM: Magnesium: 1.9 mg/dL (ref 1.5–2.5)

## 2012-03-04 LAB — CBC
Hemoglobin: 16.7 g/dL (ref 13.0–17.0)
MCH: 34.2 pg — ABNORMAL HIGH (ref 26.0–34.0)
MCHC: 35.2 g/dL (ref 30.0–36.0)

## 2012-03-04 MED ORDER — POTASSIUM CHLORIDE 20 MEQ/15ML (10%) PO LIQD
40.0000 meq | Freq: Once | ORAL | Status: DC
  Filled 2012-03-04: qty 30

## 2012-03-04 NOTE — Progress Notes (Signed)
Clinical social worker received referral for current substance abuse. However pt discharged before csw could assess patient. .No further Clinical Social Work needs, signing off.   Catha Gosselin, Theresia Majors  [phone] .03/04/2012 16:12pm

## 2012-03-04 NOTE — Discharge Summary (Signed)
DISCHARGE SUMMARY  DAVIEL Sutton  MR#: [id number]  DOB:06/07/1976  Date of Admission: 03/02/2012 Date of Discharge: 03/04/2012  Attending Physician:Jeovanny Cuadros  Patient's Ruben Sutton:WJXBJYNW,GNFAOZ M, MD, MD  The patient has left AMA  Presenting Complaint: Chest pain  Discharge Diagnoses: Chest pain- likely GI related  Acute alcohol intoxication and Alcohol withdrawal syndrome Acute Pancreatitis due to ETOH abuse  Fatty liver per CT scan  HTN (hypertension)  Bronchitis, acute, with bronchospasm  Dehydration   Discharge Medications: Did not address as patient left AMA   Procedures: Dg Chest Port 1 View  03/02/2012  *RADIOLOGY REPORT*  Clinical Data: Chest pain, EtOH  PORTABLE CHEST - 1 VIEW  Comparison: 09/07/2011  Findings: Heart size within normal limits.  Status post median sternotomy.  Blunted right costophrenic angle may reflect scarring or a trace pleural effusion.  No pneumothorax.  No new areas of consolidation.  No acute osseous abnormality.  IMPRESSION: No focal consolidation.  Original Report Authenticated By: Waneta Martins, M.D.     Hospital Course:  This is a 36 y/o male who presented to the ER with retrosternal chest pain 8/10 and periumbilical pain 5-6/10 and nausea. He abuses alcohol and had an alcohol level of 417 on admission. He was admitted and 3 sets of cardiac enzymes were checked and found to be negative. His EKG did not reveal any changes consistent with acute coronary syndrome. His pain is suspected to be related to acute pancreatitis; lipase was 127 on admission and has improved to 57 today.  His chest and abdominal pain has resolved with bowel rest. I advanced his diet today but he has left prior to receiving it.  He was also treated for acute Bronchitis and a COPD exacerbation with antibiotics and steroids.  He is undergoing ETOH withdrawal and endorses symptoms of "shakiness". He is not confused or having hallucinations. He had a headache earlier  today but this has resolved. He is insisting on leaving since he is feeling better and states that he will not drink again. I have spoken with him and strongly advised him to stay to be further treated for alcohol withdrawal but he states he will see his PCP tomorrow for this and for the bronchitis. I have explained that the withdrawal symptoms will get worse and he may have seizures as a result. He is still adamant about leaving and retuning to the ER if he feels worse.  He has been strongly advised to stop drinking and is in agreement to do so.  His dehydration has resolved with IVF.    Day of Discharge Physical Exam: BP 168/107  Pulse 89  Temp(Src) 97.5 F (36.4 C) (Oral)  Resp 19  Ht 5\' 9"  (1.753 m)  Wt 95.1 kg (209 lb 10.5 oz)  BMI 30.96 kg/m2  SpO2 97% General appearance: alert, cooperative, appears stated age and mild distress  Resp: CTA b/l no respiratory distress Cardio: regular rate and rhythm, S1, S2 normal, no murmur, click, rub or gallop  GI: soft, non-tender; bowel sounds normal; no masses, no organomegaly  Extremities: extremities normal, atraumatic, no cyanosis or edema  Neurologic: Grossly normal, no tremors observed, no psycho-motor agitation   Results for orders placed during the hospital encounter of 03/02/12 (from the past 24 hour(s))  GLUCOSE, CAPILLARY     Status: Abnormal   Collection Time   03/03/12  2:32 PM      Component Value Range   Glucose-Capillary 112 (*) 70 - 99 (mg/dL)   Comment 1 Documented in  Chart     Comment 2 Notify RN    GLUCOSE, CAPILLARY     Status: Abnormal   Collection Time   03/03/12  6:35 PM      Component Value Range   Glucose-Capillary 203 (*) 70 - 99 (mg/dL)   Comment 1 Notify RN    GLUCOSE, CAPILLARY     Status: Abnormal   Collection Time   03/03/12  8:59 PM      Component Value Range   Glucose-Capillary 176 (*) 70 - 99 (mg/dL)   Comment 1 Documented in Chart     Comment 2 Notify RN    GLUCOSE, CAPILLARY     Status: Abnormal    Collection Time   03/04/12 12:14 AM      Component Value Range   Glucose-Capillary 168 (*) 70 - 99 (mg/dL)   Comment 1 Documented in Chart     Comment 2 Notify RN    COMPREHENSIVE METABOLIC PANEL     Status: Abnormal   Collection Time   03/04/12  4:13 AM      Component Value Range   Sodium 130 (*) 135 - 145 (mEq/L)   Potassium 4.1  3.5 - 5.1 (mEq/L)   Chloride 97  96 - 112 (mEq/L)   CO2 24  19 - 32 (mEq/L)   Glucose, Bld 170 (*) 70 - 99 (mg/dL)   BUN 7  6 - 23 (mg/dL)   Creatinine, Ser 4.69  0.50 - 1.35 (mg/dL)   Calcium 9.2  8.4 - 62.9 (mg/dL)   Total Protein 7.2  6.0 - 8.3 (g/dL)   Albumin 3.2 (*) 3.5 - 5.2 (g/dL)   AST 57 (*) 0 - 37 (U/L)   ALT 53  0 - 53 (U/L)   Alkaline Phosphatase 111  39 - 117 (U/L)   Total Bilirubin 0.4  0.3 - 1.2 (mg/dL)   GFR calc non Af Amer >90  >90 (mL/min)   GFR calc Af Amer >90  >90 (mL/min)  CBC     Status: Abnormal   Collection Time   03/04/12  4:13 AM      Component Value Range   WBC 3.2 (*) 4.0 - 10.5 (K/uL)   RBC 4.88  4.22 - 5.81 (MIL/uL)   Hemoglobin 16.7  13.0 - 17.0 (g/dL)   HCT 52.8  41.3 - 24.4 (%)   MCV 97.1  78.0 - 100.0 (fL)   MCH 34.2 (*) 26.0 - 34.0 (pg)   MCHC 35.2  30.0 - 36.0 (g/dL)   RDW 01.0  27.2 - 53.6 (%)   Platelets 92 (*) 150 - 400 (K/uL)  MAGNESIUM     Status: Normal   Collection Time   03/04/12  4:13 AM      Component Value Range   Magnesium 1.9  1.5 - 2.5 (mg/dL)  PHOSPHORUS     Status: Normal   Collection Time   03/04/12  4:13 AM      Component Value Range   Phosphorus 2.8  2.3 - 4.6 (mg/dL)  LIPASE, BLOOD     Status: Normal   Collection Time   03/04/12  4:13 AM      Component Value Range   Lipase 31  11 - 59 (U/L)  GLUCOSE, CAPILLARY     Status: Abnormal   Collection Time   03/04/12  4:23 AM      Component Value Range   Glucose-Capillary 195 (*) 70 - 99 (mg/dL)   Comment 1 Documented in Chart  Comment 2 Notify RN    GLUCOSE, CAPILLARY     Status: Abnormal   Collection Time   03/04/12  7:39 AM       Component Value Range   Glucose-Capillary 165 (*) 70 - 99 (mg/dL)   Comment 1 Notify RN      Disposition: leaving AMA    Time on Discharge: >45 min in evaluating patient and later trying to convince him to stay in the hospital.   Signed: Calvert Cantor 03/04/2012, 1:30 PM

## 2012-03-04 NOTE — Progress Notes (Signed)
Patient states he is going home today. MD notified. She spoke with patient on the phone . Discussed ETOH withdrawal . Patient states he is leaving. Signed AMA form. Refused scheduled medicine and wheelchair transport. Left 2600 at 1315

## 2012-04-09 ENCOUNTER — Emergency Department (HOSPITAL_COMMUNITY): Payer: Medicaid Other

## 2012-04-09 ENCOUNTER — Encounter (HOSPITAL_COMMUNITY): Payer: Self-pay | Admitting: *Deleted

## 2012-04-09 ENCOUNTER — Emergency Department (HOSPITAL_COMMUNITY)
Admission: EM | Admit: 2012-04-09 | Discharge: 2012-04-09 | Disposition: A | Discharge status: Home / Self Care | Payer: Medicaid Other | Attending: Emergency Medicine | Admitting: Emergency Medicine

## 2012-04-09 DIAGNOSIS — M25579 Pain in unspecified ankle and joints of unspecified foot: Secondary | ICD-10-CM | POA: Insufficient documentation

## 2012-04-09 DIAGNOSIS — I10 Essential (primary) hypertension: Secondary | ICD-10-CM | POA: Insufficient documentation

## 2012-04-09 DIAGNOSIS — M25473 Effusion, unspecified ankle: Secondary | ICD-10-CM | POA: Insufficient documentation

## 2012-04-09 DIAGNOSIS — M25476 Effusion, unspecified foot: Secondary | ICD-10-CM | POA: Insufficient documentation

## 2012-04-09 DIAGNOSIS — S82899A Other fracture of unspecified lower leg, initial encounter for closed fracture: Secondary | ICD-10-CM | POA: Insufficient documentation

## 2012-04-09 DIAGNOSIS — S82891A Other fracture of right lower leg, initial encounter for closed fracture: Secondary | ICD-10-CM

## 2012-04-09 DIAGNOSIS — W19XXXA Unspecified fall, initial encounter: Secondary | ICD-10-CM | POA: Insufficient documentation

## 2012-04-09 MED ORDER — HYDROCODONE-ACETAMINOPHEN 5-500 MG PO TABS: 1.0000 | ORAL_TABLET | Freq: Four times a day (QID) | ORAL | Status: AC | PRN

## 2012-04-09 MED ORDER — NAPROXEN 500 MG PO TABS: 500.0000 mg | ORAL_TABLET | Freq: Two times a day (BID) | ORAL | Status: DC

## 2012-04-09 MED ORDER — HYDROCODONE-ACETAMINOPHEN 5-325 MG PO TABS
1.0000 | ORAL_TABLET | Freq: Once | ORAL | Status: AC
  Administered 2012-04-09: 1 via ORAL
  Filled 2012-04-09: qty 1

## 2012-04-09 NOTE — ED Notes (Signed)
Pt ambulated to the BR with steady gait but with a slight limp

## 2012-04-09 NOTE — ED Notes (Signed)
Pt reports fall x 2 weeks ago, slipped and fell twisting his ankle.  Pt reports R ankle pain and swelling.

## 2012-04-09 NOTE — ED Notes (Signed)
[facility] ortho tech at bedside to apply splint

## 2012-04-09 NOTE — ED Provider Notes (Signed)
History     CSN: [id number]  Arrival date & time 04/09/12  1120   First MD Initiated Contact with Patient 04/09/12 1122      No chief complaint on file.   (Consider location/radiation/quality/duration/timing/severity/associated sxs/prior treatment) Patient is a 36 y.o. male presenting with ankle pain. The history is provided by the patient.  Ankle Pain  The incident occurred more than 1 week ago. The injury mechanism was a fall and torsion. The pain is present in the right ankle. The quality of the pain is described as throbbing. The pain is at a severity of 6/10. The pain is moderate. The pain has been constant since onset. Pertinent negatives include no numbness, no inability to bear weight, no loss of sensation and no tingling.  PT states he fell about 2 wks ago. Since then pain in right ankle, not improving. He is walking on it, but states it is painful. He has been elevating it and icing it with no improvement.  No other injuries.   Past Medical History  Diagnosis Date  . Hypertension     No past surgical history on file.  No family history on file.  History  Substance Use Topics  . Smoking status: Current Everyday Smoker -- 1.0 packs/day for 19 years    Types: Cigarettes  . Smokeless tobacco: Not on file  . Alcohol Use: 4.2 oz/week    7 Shots of liquor per week      Review of Systems  Constitutional: Negative for fever and chills.  HENT: Negative.   Eyes: Negative.   Respiratory: Negative.   Cardiovascular: Negative.   Gastrointestinal: Negative.   Musculoskeletal: Positive for joint swelling and arthralgias.  Neurological: Negative for tingling, weakness and numbness.  Psychiatric/Behavioral: Negative.     Allergies  Review of patient's allergies indicates no known allergies.  Home Medications   Current Outpatient Rx  Name Route Sig Dispense Refill  . IBUPROFEN 200 MG PO TABS Oral Take 200 mg by mouth every 6 (six) hours as needed. PAIN      There  were no vitals taken for this visit.  Physical Exam  Nursing note and vitals reviewed. Constitutional: He is oriented to person, place, and time. He appears well-developed and well-nourished. No distress.  HENT:  Head: Normocephalic.  Eyes: Conjunctivae are normal.  Neck: Neck supple.  Cardiovascular: Normal rate, regular rhythm and normal heart sounds.   Pulmonary/Chest: Effort normal and breath sounds normal. No respiratory distress.  Musculoskeletal:       Right ankle swelling noted. Tender over lateral malleolus. Achillis tendon intact. Pain with dorsiflexion, plantarflexion, inversion, eversion.  ROM limited due to pain. Normal right dorsal pedal pulse  Neurological: He is alert and oriented to person, place, and time.  Skin: Skin is warm and dry.  Psychiatric: He has a normal mood and affect.    ED Course  Procedures (including critical care time)  Dg Ankle Complete Right  04/09/2012  *RADIOLOGY REPORT*  Clinical Data: Post fall 2 weeks ago  RIGHT ANKLE - COMPLETE 3+ VIEW  Comparison: None.  Findings: Three views of the right ankle submitted.  There is mild displaced probable subacute comminuted fracture of distal right fibula.  Some healing response noted.  IMPRESSION: Mild displaced probable subacute comminuted fracture of distal right fibula.  Original Report Authenticated By: Natasha Mead, M.D.    Fracture of the ankle on x-ray. Ankle splinted by an ortho tech. Will d/c home with crutches, orthopedics follow up.  1. Ankle fracture,  right       MDM          Lottie Mussel, PA 04/09/12 1555

## 2012-04-09 NOTE — ED Notes (Signed)
Pt smells of ETOH.

## 2012-04-09 NOTE — ED Notes (Signed)
Ortho tech called-phone busy.  Contacted x 2.  Clydie Braun Sec will attempt to contact ortho tech via Designer, television/film set

## 2012-04-09 NOTE — Discharge Instructions (Signed)
Your have a fracture of your right ankle. Keep it elevated at home. Do not remove the splint. Ice several times a day. Naprosyn for pain. Vicodin as prescribed as needed for severe pain. Follow up with orthopedics as referred, call them for an appointment.   Ankle Fracture A fracture is a break in the bone. A cast or splint is used to protect and keep your injured bone from moving.  HOME CARE INSTRUCTIONS   Use your crutches as directed.   To lessen the swelling, keep the injured leg elevated while sitting or lying down.   Apply ice to the injury for 15 to 20 minutes, 3 to 4 times per day while awake for 2 days. Put the ice in a plastic bag and place a thin towel between the bag of ice and your cast.   If you have a plaster or fiberglass cast:   Do not try to scratch the skin under the cast using sharp or pointed objects.   Check the skin around the cast every day. You may put lotion on any red or sore areas.   Keep your cast dry and clean.   If you have a plaster splint:   Wear the splint as directed.   You may loosen the elastic around the splint if your toes become numb, tingle, or turn cold or blue.   Do not put pressure on any part of your cast or splint; it may break. Rest your cast only on a pillow the first 24 hours until it is fully hardened.   Your cast or splint can be protected during bathing with a plastic bag. Do not lower the cast or splint into water.   Take medications as directed by your caregiver. Only take over-the-counter or prescription medicines for pain, discomfort, or fever as directed by your caregiver.   Do not drive a vehicle until your caregiver specifically tells you it is safe to do so.   If your caregiver has given you a follow-up appointment, it is very important to keep that appointment. Not keeping the appointment could result in a chronic or permanent injury, pain, and disability. If there is any problem keeping the appointment, you must call back  to this facility for assistance.  SEEK IMMEDIATE MEDICAL CARE IF:   Your cast gets damaged or breaks.   You have continued severe pain or more swelling than you did before the cast was put on.   Your skin or toenails below the injury turn blue or gray, or feel cold or numb.   There is a bad smell or new stains and/or purulent (pus like) drainage coming from under the cast.  If you do not have a window in your cast for observing the wound, a discharge or minor bleeding may show up as a stain on the outside of your cast. Report these findings to your caregiver. MAKE SURE YOU:   Understand these instructions.   Will watch your condition.   Will get help right away if you are not doing well or get worse.  Document Released: 12/07/2000 Document Revised: 11/29/2011 Document Reviewed: 07/13/2008 [facility] Patient Information 2012 [place], [place].

## 2012-04-10 NOTE — ED Provider Notes (Signed)
Medical screening examination/treatment/procedure(s) were performed by non-physician practitioner and as supervising physician I was immediately available for consultation/collaboration.  Pallie Swigert, MD 04/10/12 0728 

## 2012-04-26 ENCOUNTER — Emergency Department (HOSPITAL_COMMUNITY): Payer: Medicaid Other

## 2012-04-26 ENCOUNTER — Encounter (HOSPITAL_COMMUNITY): Payer: Self-pay | Admitting: *Deleted

## 2012-04-26 ENCOUNTER — Inpatient Hospital Stay (HOSPITAL_COMMUNITY)
Admission: EM | Admit: 2012-04-26 | Discharge: 2012-04-28 | DRG: 439 | Discharge status: Against Medical Advice | Payer: Medicaid Other | Source: Ambulatory Visit | Attending: Internal Medicine | Admitting: Internal Medicine

## 2012-04-26 DIAGNOSIS — R079 Chest pain, unspecified: Secondary | ICD-10-CM | POA: Diagnosis present

## 2012-04-26 DIAGNOSIS — F10929 Alcohol use, unspecified with intoxication, unspecified: Secondary | ICD-10-CM | POA: Diagnosis present

## 2012-04-26 DIAGNOSIS — K852 Alcohol induced acute pancreatitis without necrosis or infection: Secondary | ICD-10-CM | POA: Diagnosis present

## 2012-04-26 DIAGNOSIS — Z23 Encounter for immunization: Secondary | ICD-10-CM

## 2012-04-26 DIAGNOSIS — F1020 Alcohol dependence, uncomplicated: Secondary | ICD-10-CM | POA: Diagnosis present

## 2012-04-26 DIAGNOSIS — I10 Essential (primary) hypertension: Secondary | ICD-10-CM | POA: Diagnosis present

## 2012-04-26 DIAGNOSIS — F17200 Nicotine dependence, unspecified, uncomplicated: Secondary | ICD-10-CM | POA: Diagnosis present

## 2012-04-26 DIAGNOSIS — K859 Acute pancreatitis without necrosis or infection, unspecified: Secondary | ICD-10-CM

## 2012-04-26 DIAGNOSIS — F10231 Alcohol dependence with withdrawal delirium: Secondary | ICD-10-CM

## 2012-04-26 DIAGNOSIS — F10939 Alcohol use, unspecified with withdrawal, unspecified: Secondary | ICD-10-CM | POA: Diagnosis present

## 2012-04-26 DIAGNOSIS — D6959 Other secondary thrombocytopenia: Secondary | ICD-10-CM | POA: Diagnosis present

## 2012-04-26 DIAGNOSIS — E860 Dehydration: Secondary | ICD-10-CM | POA: Diagnosis present

## 2012-04-26 DIAGNOSIS — F10239 Alcohol dependence with withdrawal, unspecified: Secondary | ICD-10-CM

## 2012-04-26 DIAGNOSIS — Z951 Presence of aortocoronary bypass graft: Secondary | ICD-10-CM

## 2012-04-26 DIAGNOSIS — I2510 Atherosclerotic heart disease of native coronary artery without angina pectoris: Secondary | ICD-10-CM | POA: Diagnosis present

## 2012-04-26 DIAGNOSIS — J209 Acute bronchitis, unspecified: Secondary | ICD-10-CM | POA: Diagnosis present

## 2012-04-26 DIAGNOSIS — F1010 Alcohol abuse, uncomplicated: Secondary | ICD-10-CM

## 2012-04-26 LAB — DIFFERENTIAL
Basophils Absolute: 0 10*3/uL (ref 0.0–0.1)
Basophils Relative: 0 % (ref 0–1)
Eosinophils Absolute: 0.1 10*3/uL (ref 0.0–0.7)
Monocytes Relative: 7 % (ref 3–12)
Neutro Abs: 2.3 10*3/uL (ref 1.7–7.7)
Neutrophils Relative %: 48 % (ref 43–77)

## 2012-04-26 LAB — COMPREHENSIVE METABOLIC PANEL
ALT: 65 U/L — ABNORMAL HIGH (ref 0–53)
AST: 65 U/L — ABNORMAL HIGH (ref 0–37)
Albumin: 3.7 g/dL (ref 3.5–5.2)
Alkaline Phosphatase: 115 U/L (ref 39–117)
Chloride: 100 mEq/L (ref 96–112)
Potassium: 3.9 mEq/L (ref 3.5–5.1)
Sodium: 139 mEq/L (ref 135–145)
Total Bilirubin: 0.2 mg/dL — ABNORMAL LOW (ref 0.3–1.2)
Total Protein: 7.4 g/dL (ref 6.0–8.3)

## 2012-04-26 LAB — CBC
HCT: 43.3 % (ref 39.0–52.0)
Hemoglobin: 15.9 g/dL (ref 13.0–17.0)
MCH: 34.7 pg — ABNORMAL HIGH (ref 26.0–34.0)
MCH: 34.9 pg — ABNORMAL HIGH (ref 26.0–34.0)
MCHC: 35.2 g/dL (ref 30.0–36.0)
MCHC: 35.1 g/dL (ref 30.0–36.0)
MCV: 99.5 fL (ref 78.0–100.0)
Platelets: 121 10*3/uL — ABNORMAL LOW (ref 150–400)
Platelets: 104 10*3/uL — ABNORMAL LOW (ref 150–400)
RDW: 13.6 % (ref 11.5–15.5)

## 2012-04-26 LAB — POCT I-STAT TROPONIN I: Troponin i, poc: 0 ng/mL (ref 0.00–0.08)

## 2012-04-26 LAB — LIPASE, BLOOD: Lipase: 415 U/L — ABNORMAL HIGH (ref 11–59)

## 2012-04-26 LAB — CARDIAC PANEL(CRET KIN+CKTOT+MB+TROPI): Troponin I: <0.3 ng/mL (ref <0.30)

## 2012-04-26 LAB — ETHANOL: Alcohol, Ethyl (B): 444 mg/dL (ref 0–11)

## 2012-04-26 LAB — RAPID URINE DRUG SCREEN, HOSP PERFORMED: Benzodiazepines: NOT DETECTED

## 2012-04-26 MED ORDER — LORAZEPAM 2 MG/ML IJ SOLN
1.0000 mg | Freq: Once | INTRAMUSCULAR | Status: AC
  Administered 2012-04-26: 1 mg via INTRAVENOUS
  Filled 2012-04-26: qty 1

## 2012-04-26 MED ORDER — FOLIC ACID 1 MG PO TABS
1.0000 mg | ORAL_TABLET | Freq: Every day | ORAL | Status: DC
  Administered 2012-04-26 – 2012-04-28 (×3): 1 mg via ORAL
  Filled 2012-04-26 (×3): qty 1

## 2012-04-26 MED ORDER — LORAZEPAM 2 MG/ML IJ SOLN: 0.0000 mg | Freq: Two times a day (BID) | INTRAMUSCULAR | Status: DC

## 2012-04-26 MED ORDER — ONDANSETRON HCL 4 MG/2ML IJ SOLN
4.0000 mg | Freq: Once | INTRAMUSCULAR | Status: AC
  Administered 2012-04-26: 4 mg via INTRAVENOUS
  Filled 2012-04-26: qty 2

## 2012-04-26 MED ORDER — ENOXAPARIN SODIUM 40 MG/0.4ML ~~LOC~~ SOLN
40.0000 mg | SUBCUTANEOUS | Status: DC
  Administered 2012-04-26 – 2012-04-27 (×2): 40 mg via SUBCUTANEOUS
  Filled 2012-04-26 (×3): qty 0.4

## 2012-04-26 MED ORDER — SODIUM CHLORIDE 0.9 % IJ SOLN
3.0000 mL | Freq: Two times a day (BID) | INTRAMUSCULAR | Status: DC
  Administered 2012-04-26 – 2012-04-28 (×3): 3 mL via INTRAVENOUS

## 2012-04-26 MED ORDER — ALBUTEROL SULFATE (5 MG/ML) 0.5% IN NEBU
2.5000 mg | INHALATION_SOLUTION | Freq: Four times a day (QID) | RESPIRATORY_TRACT | Status: DC
  Administered 2012-04-26 – 2012-04-28 (×5): 2.5 mg via RESPIRATORY_TRACT
  Filled 2012-04-26 (×5): qty 0.5

## 2012-04-26 MED ORDER — ONDANSETRON HCL 4 MG PO TABS: 4.0000 mg | ORAL_TABLET | Freq: Four times a day (QID) | ORAL | Status: DC | PRN

## 2012-04-26 MED ORDER — SODIUM CHLORIDE 0.9 % IV BOLUS (SEPSIS)
500.0000 mL | Freq: Once | INTRAVENOUS | Status: AC
  Administered 2012-04-26: 1000 mL via INTRAVENOUS

## 2012-04-26 MED ORDER — ALBUTEROL SULFATE (5 MG/ML) 0.5% IN NEBU: 2.5000 mg | INHALATION_SOLUTION | RESPIRATORY_TRACT | Status: DC | PRN

## 2012-04-26 MED ORDER — SODIUM CHLORIDE 0.9 % IV SOLN
INTRAVENOUS | Status: DC
  Administered 2012-04-26: 150 mL/h via INTRAVENOUS
  Administered 2012-04-28 (×2): via INTRAVENOUS

## 2012-04-26 MED ORDER — MORPHINE SULFATE 4 MG/ML IJ SOLN
4.0000 mg | Freq: Once | INTRAMUSCULAR | Status: AC
  Administered 2012-04-26: 4 mg via INTRAVENOUS
  Filled 2012-04-26: qty 1

## 2012-04-26 MED ORDER — VITAMIN B-1 100 MG PO TABS
100.0000 mg | ORAL_TABLET | Freq: Every day | ORAL | Status: DC
  Administered 2012-04-26 – 2012-04-28 (×3): 100 mg via ORAL
  Filled 2012-04-26 (×3): qty 1

## 2012-04-26 MED ORDER — MORPHINE SULFATE 2 MG/ML IJ SOLN: 2.0000 mg | INTRAMUSCULAR | Status: DC | PRN

## 2012-04-26 MED ORDER — LORAZEPAM 1 MG PO TABS
1.0000 mg | ORAL_TABLET | Freq: Four times a day (QID) | ORAL | Status: DC | PRN
  Administered 2012-04-27: 1 mg via ORAL
  Filled 2012-04-26: qty 1

## 2012-04-26 MED ORDER — ADULT MULTIVITAMIN W/MINERALS CH
1.0000 | ORAL_TABLET | Freq: Every day | ORAL | Status: DC
  Administered 2012-04-27 – 2012-04-28 (×2): 1 via ORAL
  Filled 2012-04-26 (×3): qty 1

## 2012-04-26 MED ORDER — ONDANSETRON HCL 4 MG/2ML IJ SOLN: 4.0000 mg | Freq: Four times a day (QID) | INTRAMUSCULAR | Status: DC | PRN

## 2012-04-26 MED ORDER — LORAZEPAM 2 MG/ML IJ SOLN
0.0000 mg | Freq: Four times a day (QID) | INTRAMUSCULAR | Status: DC
  Administered 2012-04-26: 4 mg via INTRAVENOUS
  Administered 2012-04-27 (×2): 2 mg via INTRAVENOUS
  Administered 2012-04-27: 3 mg via INTRAVENOUS
  Administered 2012-04-27 – 2012-04-28 (×3): 2 mg via INTRAVENOUS
  Filled 2012-04-26: qty 1
  Filled 2012-04-26 (×2): qty 2
  Filled 2012-04-26 (×4): qty 1

## 2012-04-26 MED ORDER — ALBUTEROL SULFATE (5 MG/ML) 0.5% IN NEBU
5.0000 mg | INHALATION_SOLUTION | Freq: Once | RESPIRATORY_TRACT | Status: AC
  Administered 2012-04-26: 5 mg via RESPIRATORY_TRACT
  Filled 2012-04-26: qty 1

## 2012-04-26 MED ORDER — ALUM & MAG HYDROXIDE-SIMETH 200-200-20 MG/5ML PO SUSP
30.0000 mL | Freq: Once | ORAL | Status: AC
  Administered 2012-04-26: 30 mL via ORAL
  Filled 2012-04-26: qty 30

## 2012-04-26 MED ORDER — NITROGLYCERIN 0.4 MG SL SUBL
0.4000 mg | SUBLINGUAL_TABLET | SUBLINGUAL | Status: DC | PRN
  Administered 2012-04-26: 0.4 mg via SUBLINGUAL
  Filled 2012-04-26 (×2): qty 75

## 2012-04-26 MED ORDER — THIAMINE HCL 100 MG/ML IJ SOLN: INTRAVENOUS | Status: DC

## 2012-04-26 MED ORDER — LORAZEPAM 2 MG/ML IJ SOLN
1.0000 mg | Freq: Four times a day (QID) | INTRAMUSCULAR | Status: DC | PRN
  Administered 2012-04-28: 1 mg via INTRAVENOUS
  Filled 2012-04-26: qty 1

## 2012-04-26 MED ORDER — THIAMINE HCL 100 MG/ML IJ SOLN
100.0000 mg | Freq: Every day | INTRAMUSCULAR | Status: DC
  Filled 2012-04-26 (×3): qty 1

## 2012-04-26 MED ORDER — PNEUMOCOCCAL VAC POLYVALENT 25 MCG/0.5ML IJ INJ
0.5000 mL | INJECTION | INTRAMUSCULAR | Status: AC
  Filled 2012-04-26: qty 0.5

## 2012-04-26 MED ORDER — HYDRALAZINE HCL 20 MG/ML IJ SOLN
10.0000 mg | INTRAMUSCULAR | Status: DC | PRN
  Administered 2012-04-28: 10 mg via INTRAVENOUS
  Filled 2012-04-26: qty 0.5

## 2012-04-26 NOTE — Progress Notes (Signed)
[facility] progress note. Chief complaint. Chest pain. History of present illness. This 36 year old male with past medical history of EtOH abuse admitted today with complaints of chest pain 8/10 in intensity. Initial ER troponin was negative and further cardiac enzymes are pending every 8 hours for 3 sets. The patient complained of chest pain again on the floor in step down. I came to the bedside and an EKG was done. It shows sinus tachycardia but no indication of acute ischemia. The patient was ordered and administered sublingual nitroglycerin 0.4 mg by mouth every 5 minutes. He did receive 3 doses and states that his chest pain is resolved. On onset he describes the chest pain as 8/10. This was radiation to the left arm and abdomen. No nausea or diaphoresis. Physical exam. Vital signs. Temperature 98.1, pulse 135, respiration 19, blood pressure 136/88. O2 sats 93%. General appearance. Well-developed middle-aged male in no acute distress. He does appear somewhat anxious and his speech is somewhat slurred. Cardiac. Regular and tachycardic. No jugular venous distention or edema. Negative Homans. Lungs. Breath sounds are clear and equal bilaterally. Abdomen. Soft with positive bowel sounds. No pain. Impression/plan. Problem #1 chest pain. Pain relieved with sublingual nitroglycerin. No EKG changes to suggest ischemia. I will leave sublingual nitroglycerin as a standing order. We'll follow for the pending cardiac enzymes. Will repeat an EKG in the a.m. I will give him dose of Maalox in case this is GI reflux mediated. Problem #2. Tachycardia. Will continue to treat this with Ativan per CIWA protocol. Problem #3. Hypertension. Patient already has when necessary hydralazine in place.

## 2012-04-26 NOTE — ED Notes (Signed)
Wynne Dust (patient's aunt) [phone]
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
## 2012-04-26 NOTE — ED Notes (Signed)
Patient with c/o chest pain and shortness of breath.  Patient also c/o right sided abdominal pain.  Patient has history of pancreatitis and admits to etoh.

## 2012-04-26 NOTE — ED Provider Notes (Signed)
Medical screening examination/treatment/procedure(s) were performed by non-physician practitioner and as supervising physician I was immediately available for consultation/collaboration.  Juliet Rude. Rubin Payor, MD 04/26/12 2340

## 2012-04-26 NOTE — ED Notes (Signed)
Patient states that he drinks corn liquor and he last drank yesterday.

## 2012-04-26 NOTE — ED Notes (Signed)
Gave old and new ECG to Dr. Rubin Payor. 4:28pm JG.

## 2012-04-26 NOTE — ED Notes (Signed)
MD at bedside. Admitting MD.

## 2012-04-26 NOTE — H&P (Signed)
Patient's PCP: [name], MD, MD  Chief Complaint: chest pain  History of Present Illness: Ruben Sutton is a 36 y.o. white male who was here recently (March) and left AMA.  past medical history of alcohol abuse, hypertension who presented to emergency department with complaints of retrosternal chest pain, 8/10 in intensity, constant, nonradiating, pressure like, started at rest. There was associated cough but NO shortness of breath or palpitations. Patient reports abusing alcohol on daily basis. Last drink was one day prior to admission, vodka and beer/mmonshine.  He has been through withdrawals before including seizures.   He reports feeling very restless and shaking. There are no reports of fever or chills. No reports of blood in stool or urine. No complaints of diarrhea or constipation. No lightheadedness or dizziness.  Recently in ER with a broken foot     Meds: Scheduled Meds:    . albuterol  5 mg Nebulization Once  . LORazepam  1 mg Intravenous Once  . LORazepam  1 mg Intravenous Once  .  morphine injection  4 mg Intravenous Once  . ondansetron (ZOFRAN) IV  4 mg Intravenous Once  . sodium chloride  500 mL Intravenous Once   Continuous Infusions:  PRN Meds:. Allergies: Review of patient's allergies indicates no known allergies. Past Medical History  Diagnosis Date  . Hypertension     not taking meds   Past Surgical History  Procedure Date  . Coronary artery bypass graft    History reviewed. No pertinent family history. History   Social History  . Marital Status: Legally Separated    Spouse Name: N/A    Number of Children: N/A  . Years of Education: N/A   Occupational History  . Not on file.   Social History Main Topics  . Smoking status: Current Everyday Smoker -- 1.0 packs/day for 19 years    Types: Cigarettes  . Smokeless tobacco: Not on file  . Alcohol Use: 4.2 oz/week    7 Shots of liquor per week  . Drug Use: No  . Sexually Active: Yes    Other Topics Concern  . Not on file   Social History Narrative  . No narrative on file   Review of Systems: All systems reviewed with the patient and positive as per history of present illness, otherwise all other systems are negative.   Physical Exam: Blood pressure 163/112, pulse 125, temperature 98.5 F (36.9 C), temperature source Oral, resp. rate 21, SpO2 86.00%.  Constitutional: Vital signs reviewed. Patient is cooperative with exam. Alert and oriented x3; restless  Head: Normocephalic and atraumatic  Ear: TM normal bilaterally  Mouth: no erythema or exudates, MMM  Eyes: PERRL, EOMI, conjunctivae normal, No scleral icterus.  Neck: Supple, Trachea midline normal ROM, No JVD, mass, thyromegaly, or carotid bruit present.  Cardiovascular: RRR, S1 normal, S2 normal, no MRG, pulses symmetric and intact bilaterally  Pulmonary/Chest: CTAB, no wheezes, rales, or rhonchi  Abdominal: Soft. + epigastric-tender, non-distended, bowel sounds are normal, no masses, organomegaly, or guarding present.  GU: no CVA tenderness Musculoskeletal: R ankle swollen and tender Ext: no edema and no cyanosis, pulses palpable bilaterally (DP and PT)  Hematology: no cervical, inginal, or axillary adenopathy.  Neurological: A&O x3, Strenght is normal and symmetric bilaterally, cranial nerve II-XII are grossly intact, no focal motor deficit, sensory intact to light touch bilaterally.  Skin: multiple "home grown" tatoos  Psychiatric: has tremors   Lab results:  Basename 04/26/12 1632  NA 139  K 3.9  CL 100  CO2 25  GLUCOSE 92  BUN 7  CREATININE 0.72  CALCIUM 8.2*  MG --  PHOS --    Basename 04/26/12 1632  AST 65*  ALT 65*  ALKPHOS 115  BILITOT 0.2*  PROT 7.4  ALBUMIN 3.7    Basename 04/26/12 1632  LIPASE 415*  AMYLASE --    Basename 04/26/12 1632  WBC 4.8  NEUTROABS 2.3  HGB 15.9  HCT 45.2  MCV 98.7  PLT 121*   No results found for this basename:  CKTOTAL:3,CKMB:3,CKMBINDEX:3,TROPONINI:3 in the last 72 hours No components found with this basename: POCBNP:3 No results found for this basename: DDIMER in the last 72 hours No results found for this basename: HGBA1C:2 in the last 72 hours No results found for this basename: CHOL:2,HDL:2,LDLCALC:2,TRIG:2,CHOLHDL:2,LDLDIRECT:2 in the last 72 hours No results found for this basename: TSH,T4TOTAL,FREET3,T3FREE,THYROIDAB in the last 72 hours No results found for this basename: VITAMINB12:2,FOLATE:2,FERRITIN:2,TIBC:2,IRON:2,RETICCTPCT:2 in the last 72 hours Imaging results:  Dg Chest 2 View  04/26/2012  *RADIOLOGY REPORT*  Clinical Data: Chest pain and shortness of breath  CHEST - 2 VIEW  Comparison: March 02, 2012  Findings: The cardiac silhouette, mediastinum, pulmonary vasculature are within normal limits.  Both lungs are clear. There is no acute bony abnormality.  IMPRESSION: There is no evidence of acute cardiac or pulmonary process.  Original Report Authenticated By: Brandon Melnick, M.D.   Dg Ankle Complete Right  04/09/2012  *RADIOLOGY REPORT*  Clinical Data: Post fall 2 weeks ago  RIGHT ANKLE - COMPLETE 3+ VIEW  Comparison: None.  Findings: Three views of the right ankle submitted.  There is mild displaced probable subacute comminuted fracture of distal right fibula.  Some healing response noted.  IMPRESSION: Mild displaced probable subacute comminuted fracture of distal right fibula.  Original Report Authenticated By: Natasha Mead, M.D.   Other results: EKG: sinus tach  Assessment & Plan by Problem:    *Acute alcohol intoxication  - alcohol level elevated- 444 - start CIWA protocol  - monitor for withdrawals in step down  Pancreatitis, alcoholic, acute  - continue IV fluids, NS @ 125 cc/hr  - keep NPO  - zofran Q 4 hours PRN nausea and vomiting   Chest pain  - rule out ACS/MI  - the first set of troponins is negative  - cycle cardiac enzymes for total of 3 sets    Thrombocytopenia Secondary to alcohol abuse  Disposition  - to stepdown for alcohol withdrawal- if requiring too much ativan or unable to control will have low threshold to call CCM      Time spent on admission, talking to the patient, and coordinating care was: 45 mins.  Nieko Clarin, DO 04/26/2012, 7:04 PM

## 2012-04-27 DIAGNOSIS — K859 Acute pancreatitis without necrosis or infection, unspecified: Secondary | ICD-10-CM

## 2012-04-27 DIAGNOSIS — F10231 Alcohol dependence with withdrawal delirium: Secondary | ICD-10-CM

## 2012-04-27 DIAGNOSIS — F1010 Alcohol abuse, uncomplicated: Secondary | ICD-10-CM

## 2012-04-27 DIAGNOSIS — R079 Chest pain, unspecified: Secondary | ICD-10-CM

## 2012-04-27 LAB — COMPREHENSIVE METABOLIC PANEL
AST: 65 U/L — ABNORMAL HIGH (ref 0–37)
Albumin: 3.3 g/dL — ABNORMAL LOW (ref 3.5–5.2)
Alkaline Phosphatase: 98 U/L (ref 39–117)
BUN: 7 mg/dL (ref 6–23)
Potassium: 3.7 mEq/L (ref 3.5–5.1)
Sodium: 138 mEq/L (ref 135–145)
Total Protein: 6.4 g/dL (ref 6.0–8.3)

## 2012-04-27 LAB — CBC
HCT: 40.5 % (ref 39.0–52.0)
MCH: 34.5 pg — ABNORMAL HIGH (ref 26.0–34.0)
MCV: 100.5 fL — ABNORMAL HIGH (ref 78.0–100.0)
RDW: 13.7 % (ref 11.5–15.5)
WBC: 4 10*3/uL (ref 4.0–10.5)

## 2012-04-27 LAB — CARDIAC PANEL(CRET KIN+CKTOT+MB+TROPI)
CK, MB: 2.9 ng/mL (ref 0.3–4.0)
Relative Index: 2.4 (ref 0.0–2.5)
Relative Index: 2.3 (ref 0.0–2.5)
Troponin I: <0.3 ng/mL (ref <0.30)
Troponin I: <0.3 ng/mL (ref <0.30)

## 2012-04-27 MED ORDER — LEVOFLOXACIN IN D5W 500 MG/100ML IV SOLN
500.0000 mg | INTRAVENOUS | Status: DC
  Administered 2012-04-27 – 2012-04-28 (×2): 500 mg via INTRAVENOUS
  Filled 2012-04-27 (×2): qty 100

## 2012-04-27 NOTE — Progress Notes (Signed)
Pt states having 8/10 CP. EKG obtained. 2 SL nitros given with complete relief. On-call notified. CE negative at this time. Will continue to monitor.  Perkins, [place] Elizabeth

## 2012-04-27 NOTE — Progress Notes (Signed)
Subjective: Feeling better, requesting food No CP Still with cough  Objective: Vital signs in last 24 hours: Filed Vitals:   04/27/12 1000 04/27/12 1100 04/27/12 1144 04/27/12 1200  BP: 155/91 111/81  153/92  Pulse: 103 96  86  Temp:   98.3 F (36.8 C)   TempSrc:      Resp:      Height:      Weight:      SpO2: 96% 97%  97%   Weight change:   Intake/Output Summary (Last 24 hours) at 04/27/12 1257 Last data filed at 04/27/12 1200  Gross per 24 hour  Intake   2895 ml  Output    625 ml  Net   2270 ml    Physical Exam: General: Awake, Oriented, No acute distress. HEENT: EOMI. Neck: Supple CV: S1 and S2 Lungs: Clear to ascultation bilaterally Abdomen: Soft, Nontender, Nondistended, +bowel sounds. Ext: Good pulses. Trace edema. Skin: multiple tatoos  Lab Results:  Basename 04/27/12 0520 04/26/12 1632  NA 138 139  K 3.7 3.9  CL 100 100  CO2 25 25  GLUCOSE 69* 92  BUN 7 7  CREATININE 0.65 0.72  CALCIUM 8.0* 8.2*  MG -- --  PHOS -- --    Basename 04/27/12 0520 04/26/12 1632  AST 65* 65*  ALT 62* 65*  ALKPHOS 98 115  BILITOT 0.3 0.2*  PROT 6.4 7.4  ALBUMIN 3.3* 3.7    Basename 04/26/12 1632  LIPASE 415*  AMYLASE --    Basename 04/27/12 0520 04/26/12 2116 04/26/12 1632  WBC 4.0 5.1 --  NEUTROABS -- -- 2.3  HGB 13.9 15.2 --  HCT 40.5 43.3 --  MCV 100.5* 99.5 --  PLT 81* 104* --    Basename 04/27/12 0520 04/26/12 2116  CKTOTAL 121 174  CKMB 2.9 3.6  CKMBINDEX -- --  TROPONINI <0.30 <0.30   No components found with this basename: POCBNP:3 No results found for this basename: DDIMER:2 in the last 72 hours No results found for this basename: HGBA1C:2 in the last 72 hours No results found for this basename: CHOL:2,HDL:2,LDLCALC:2,TRIG:2,CHOLHDL:2,LDLDIRECT:2 in the last 72 hours No results found for this basename: TSH,T4TOTAL,FREET3,T3FREE,THYROIDAB in the last 72 hours No results found for this basename:  VITAMINB12:2,FOLATE:2,FERRITIN:2,TIBC:2,IRON:2,RETICCTPCT:2 in the last 72 hours  Micro Results: Recent Results (from the past 240 hour(s))  MRSA PCR SCREENING     Status: Normal   Collection Time   04/26/12  8:38 PM      Component Value Range Status Comment   MRSA by PCR NEGATIVE  NEGATIVE  Final     Studies/Results: Dg Chest 2 View  04/26/2012  *RADIOLOGY REPORT*  Clinical Data: Chest pain and shortness of breath  CHEST - 2 VIEW  Comparison: March 02, 2012  Findings: The cardiac silhouette, mediastinum, pulmonary vasculature are within normal limits.  Both lungs are clear. There is no acute bony abnormality.  IMPRESSION: There is no evidence of acute cardiac or pulmonary process.  Original Report Authenticated By: Brandon Melnick, M.D.    Medications: I have reviewed the patient's current medications. Scheduled Meds:   . albuterol  2.5 mg Nebulization Q6H  . albuterol  5 mg Nebulization Once  . alum & mag hydroxide-simeth  30 mL Oral Once  . enoxaparin  40 mg Subcutaneous Q24H  . folic acid  1 mg Oral Daily  . levofloxacin (LEVAQUIN) IV  500 mg Intravenous Q24H  . LORazepam  0-4 mg Intravenous Q6H   Followed by  . LORazepam  0-4 mg Intravenous Q12H  . LORazepam  1 mg Intravenous Once  . LORazepam  1 mg Intravenous Once  .  morphine injection  4 mg Intravenous Once  . mulitivitamin with minerals  1 tablet Oral Daily  . ondansetron (ZOFRAN) IV  4 mg Intravenous Once  . pneumococcal 23 valent vaccine  0.5 mL Intramuscular Tomorrow-1000  . sodium chloride  500 mL Intravenous Once  . sodium chloride  3 mL Intravenous Q12H  . thiamine  100 mg Oral Daily   Or  . thiamine  100 mg Intravenous Daily   Continuous Infusions:   . sodium chloride 150 mL/hr at 04/27/12 0000  . DISCONTD: general admission iv infusion     PRN Meds:.albuterol, hydrALAZINE, LORazepam, LORazepam, morphine injection, nitroGLYCERIN, ondansetron (ZOFRAN) IV, ondansetron  Assessment/Plan:   Acute alcohol  intoxication- should be out of his system- will need to watch for withdrawal  Pancreatitis, alcoholic, acute- check lipase, clear liquids plan to advance as tolerated, trend and monitor  Chest pain- CE negative  HTN (hypertension)- continue medications  Bronchitis, acute, with bronchospasm- nebs and abx  Dehydration- IVF  Alcohol withdrawal syndrome- CIWA- has had seizures in the past  tx out of step down tomm if stable   LOS: 1 day  Alejandra Hunt, DO 04/27/2012, 12:57 PM

## 2012-04-27 NOTE — Progress Notes (Signed)
Pt complaining of 10/10 CP. EKG obtained. 3 nitros given q46mins. Pt states complete relief. On-call notified and at bedside to further evaluate. Will continue to monitor.  Perkins, [place] Elizabeth

## 2012-04-28 DIAGNOSIS — K859 Acute pancreatitis without necrosis or infection, unspecified: Secondary | ICD-10-CM

## 2012-04-28 DIAGNOSIS — F1010 Alcohol abuse, uncomplicated: Secondary | ICD-10-CM

## 2012-04-28 DIAGNOSIS — R209 Unspecified disturbances of skin sensation: Secondary | ICD-10-CM

## 2012-04-28 DIAGNOSIS — I10 Essential (primary) hypertension: Secondary | ICD-10-CM

## 2012-04-28 LAB — CBC
HCT: 43.3 % (ref 39.0–52.0)
MCHC: 34.4 g/dL (ref 30.0–36.0)
MCV: 99.8 fL (ref 78.0–100.0)
RDW: 13.1 % (ref 11.5–15.5)

## 2012-04-28 LAB — COMPREHENSIVE METABOLIC PANEL
ALT: 58 U/L — ABNORMAL HIGH (ref 0–53)
Albumin: 3.1 g/dL — ABNORMAL LOW (ref 3.5–5.2)
Alkaline Phosphatase: 101 U/L (ref 39–117)
CO2: 27 mEq/L (ref 19–32)
Chloride: 99 mEq/L (ref 96–112)
Creatinine, Ser: 0.71 mg/dL (ref 0.50–1.35)
GFR calc non Af Amer: >90 mL/min (ref >90)
Glucose, Bld: 81 mg/dL (ref 70–99)
Potassium: 3.8 mEq/L (ref 3.5–5.1)
Sodium: 136 mEq/L (ref 135–145)
Total Protein: 6.5 g/dL (ref 6.0–8.3)

## 2012-04-28 LAB — LIPID PANEL: LDL Cholesterol: 100 mg/dL — ABNORMAL HIGH (ref 0–99)

## 2012-04-28 MED ORDER — ALBUTEROL SULFATE (5 MG/ML) 0.5% IN NEBU
2.5000 mg | INHALATION_SOLUTION | Freq: Three times a day (TID) | RESPIRATORY_TRACT | Status: DC
  Administered 2012-04-28: 2.5 mg via RESPIRATORY_TRACT
  Filled 2012-04-28: qty 0.5

## 2012-04-28 MED ORDER — METOPROLOL TARTRATE 25 MG PO TABS
25.0000 mg | ORAL_TABLET | Freq: Two times a day (BID) | ORAL | Status: DC
  Administered 2012-04-28: 25 mg via ORAL
  Filled 2012-04-28 (×3): qty 1

## 2012-04-28 NOTE — Progress Notes (Signed)
Utilization Review Completed.  Ruben Sutton T  04/28/2012  

## 2012-04-28 NOTE — Progress Notes (Signed)
1130 Patient arrived to floor from 2900, nontelemetry. Noted rash on back.

## 2012-04-28 NOTE — Progress Notes (Signed)
Subjective: Feeling better, requesting solid  food No CP Cough better   Objective: Vital signs in last 24 hours: Filed Vitals:   04/28/12 0000 04/28/12 0133 04/28/12 0300 04/28/12 0400  BP: 150/97  156/98 134/82  Pulse: 78  96 80  Temp: 98.5 F (36.9 C)  99 F (37.2 C)   TempSrc: Oral  Oral   Resp: 17  19 18   Height:      Weight:      SpO2: 98% 97% 98% 97%   Weight change:   Intake/Output Summary (Last 24 hours) at 04/28/12 0825 Last data filed at 04/28/12 0400  Gross per 24 hour  Intake   3940 ml  Output   2650 ml  Net   1290 ml    Physical Exam: General: Awake, Oriented, No acute distress. HEENT: EOMI. Neck: Supple CV: S1 and S2 Lungs: Clear to ascultation bilaterally Abdomen: Soft, Nontender, Nondistended, +bowel sounds. Ext: Good pulses. No edema, mild tremor Skin: multiple tatoos  Lab Results:  Basename 04/28/12 0544 04/27/12 0520  NA 136 138  K 3.8 3.7  CL 99 100  CO2 27 25  GLUCOSE 81 69*  BUN 5* 7  CREATININE 0.71 0.65  CALCIUM 8.8 8.0*  MG -- --  PHOS -- --    Basename 04/28/12 0544 04/27/12 0520  AST 63* 65*  ALT 58* 62*  ALKPHOS 101 98  BILITOT 0.5 0.3  PROT 6.5 6.4  ALBUMIN 3.1* 3.3*    Basename 04/28/12 0544 04/26/12 1632  LIPASE 45 415*  AMYLASE -- --    Basename 04/28/12 0544 04/27/12 0520 04/26/12 1632  WBC 3.5* 4.0 --  NEUTROABS -- -- 2.3  HGB 14.9 13.9 --  HCT 43.3 40.5 --  MCV 99.8 100.5* --  PLT 81* 81* --    Basename 04/27/12 1210 04/27/12 0520 04/26/12 2116  CKTOTAL 116 121 174  CKMB 2.7 2.9 3.6  CKMBINDEX -- -- --  TROPONINI <0.30 <0.30 <0.30   No components found with this basename: POCBNP:3 No results found for this basename: DDIMER:2 in the last 72 hours No results found for this basename: HGBA1C:2 in the last 72 hours  Basename 04/28/12 0544  CHOL 199  HDL 73  LDLCALC 100*  TRIG 129  CHOLHDL 2.7  LDLDIRECT --   No results found for this basename: TSH,T4TOTAL,FREET3,T3FREE,THYROIDAB in the last  72 hours No results found for this basename: VITAMINB12:2,FOLATE:2,FERRITIN:2,TIBC:2,IRON:2,RETICCTPCT:2 in the last 72 hours  Micro Results: Recent Results (from the past 240 hour(s))  MRSA PCR SCREENING     Status: Normal   Collection Time   04/26/12  8:38 PM      Component Value Range Status Comment   MRSA by PCR NEGATIVE  NEGATIVE  Final     Studies/Results: Dg Chest 2 View  04/26/2012  *RADIOLOGY REPORT*  Clinical Data: Chest pain and shortness of breath  CHEST - 2 VIEW  Comparison: March 02, 2012  Findings: The cardiac silhouette, mediastinum, pulmonary vasculature are within normal limits.  Both lungs are clear. There is no acute bony abnormality.  IMPRESSION: There is no evidence of acute cardiac or pulmonary process.  Original Report Authenticated By: Brandon Melnick, M.D.    Medications: I have reviewed the patient's current medications. Scheduled Meds:    . albuterol  2.5 mg Nebulization TID  . enoxaparin  40 mg Subcutaneous Q24H  . folic acid  1 mg Oral Daily  . levofloxacin (LEVAQUIN) IV  500 mg Intravenous Q24H  . LORazepam  0-4  mg Intravenous Q6H   Followed by  . LORazepam  0-4 mg Intravenous Q12H  . mulitivitamin with minerals  1 tablet Oral Daily  . pneumococcal 23 valent vaccine  0.5 mL Intramuscular Tomorrow-1000  . sodium chloride  3 mL Intravenous Q12H  . thiamine  100 mg Oral Daily   Or  . thiamine  100 mg Intravenous Daily  . DISCONTD: albuterol  2.5 mg Nebulization Q6H   Continuous Infusions:    . sodium chloride 150 mL/hr at 04/28/12 0723   PRN Meds:.albuterol, hydrALAZINE, LORazepam, LORazepam, morphine injection, nitroGLYCERIN, ondansetron (ZOFRAN) IV, ondansetron  Assessment/Plan:   Acute alcohol intoxication- should be out of his system- will need to watch for withdrawal  Pancreatitis, alcoholic, acute- lipase back to normal, advancing diet  Chest pain- CE negative  HTN (hypertension)- continue medications  Bronchitis, acute, with  bronchospasm- nebs and abx  Dehydration- IVF  Alcohol withdrawal syndrome- CIWA- wean down ativan   tx to floor and hope to D/C tomm   LOS: 2 days  Orlanda Lemmerman, DO 04/28/2012, 8:25 AM

## 2012-04-28 NOTE — Progress Notes (Signed)
Ruben Sutton Patient leaving against medical advice  Dr. Benjamine Mola made aware and came up to talk with patient . Patient stated he still wanted to leave. Form signed by patient.

## 2012-04-28 NOTE — Progress Notes (Signed)
Ruben Sutton is a 1 ppd smoker who is in action stage and wants to quit. Voices understanding of consequences if he doesn't stop smoking. He says he needs help with quitting. Recommended the 21 mg patch for the pt to start out with. Discussed patch use instructions and how to taper. Pt verbalized understanding. Referred to 1-800 quit now for f/u and support. Discussed oral fixation substitutes, second hand smoke and in home smoking policy. Reviewed and gave pt Written education/contact information.

## 2012-05-07 NOTE — Discharge Summary (Signed)
Patient left AMA.

## 2012-10-21 ENCOUNTER — Emergency Department (HOSPITAL_COMMUNITY)
Admission: EM | Admit: 2012-10-21 | Discharge: 2012-10-22 | Disposition: A | Discharge status: Home / Self Care | Payer: Medicaid Other | Attending: Emergency Medicine | Admitting: Emergency Medicine

## 2012-10-21 ENCOUNTER — Emergency Department (HOSPITAL_COMMUNITY): Payer: Medicaid Other

## 2012-10-21 DIAGNOSIS — F17200 Nicotine dependence, unspecified, uncomplicated: Secondary | ICD-10-CM | POA: Insufficient documentation

## 2012-10-21 DIAGNOSIS — F1010 Alcohol abuse, uncomplicated: Secondary | ICD-10-CM | POA: Insufficient documentation

## 2012-10-21 DIAGNOSIS — R780 Finding of alcohol in blood: Secondary | ICD-10-CM

## 2012-10-21 DIAGNOSIS — I10 Essential (primary) hypertension: Secondary | ICD-10-CM | POA: Insufficient documentation

## 2012-10-21 DIAGNOSIS — R1011 Right upper quadrant pain: Secondary | ICD-10-CM | POA: Insufficient documentation

## 2012-10-21 DIAGNOSIS — R110 Nausea: Secondary | ICD-10-CM | POA: Insufficient documentation

## 2012-10-21 DIAGNOSIS — J45909 Unspecified asthma, uncomplicated: Secondary | ICD-10-CM | POA: Insufficient documentation

## 2012-10-21 LAB — CBC
HCT: 46.6 % (ref 39.0–52.0)
MCH: 34 pg (ref 26.0–34.0)
MCHC: 35 g/dL (ref 30.0–36.0)
MCV: 97.3 fL (ref 78.0–100.0)
RDW: 13.6 % (ref 11.5–15.5)

## 2012-10-21 LAB — COMPREHENSIVE METABOLIC PANEL
Albumin: 3.6 g/dL (ref 3.5–5.2)
BUN: 6 mg/dL (ref 6–23)
Creatinine, Ser: 0.77 mg/dL (ref 0.50–1.35)
Total Protein: 7.5 g/dL (ref 6.0–8.3)

## 2012-10-21 LAB — POCT I-STAT TROPONIN I

## 2012-10-21 LAB — ETHANOL: Alcohol, Ethyl (B): 418 mg/dL (ref 0–11)

## 2012-10-21 LAB — LIPASE, BLOOD: Lipase: 75 U/L — ABNORMAL HIGH (ref 11–59)

## 2012-10-21 MED ORDER — PANTOPRAZOLE SODIUM 40 MG IV SOLR
40.0000 mg | Freq: Once | INTRAVENOUS | Status: AC
  Administered 2012-10-21: 40 mg via INTRAVENOUS
  Filled 2012-10-21: qty 40

## 2012-10-21 MED ORDER — NITROGLYCERIN 0.4 MG SL SUBL
0.4000 mg | SUBLINGUAL_TABLET | SUBLINGUAL | Status: DC | PRN
  Administered 2012-10-21: 0.4 mg via SUBLINGUAL
  Filled 2012-10-21: qty 25

## 2012-10-21 MED ORDER — SODIUM CHLORIDE 0.9 % IV SOLN
Freq: Once | INTRAVENOUS | Status: AC
  Administered 2012-10-21 – 2012-10-22 (×2): via INTRAVENOUS

## 2012-10-21 MED ORDER — ONDANSETRON HCL 4 MG/2ML IJ SOLN
4.0000 mg | Freq: Once | INTRAMUSCULAR | Status: AC
  Administered 2012-10-21: 4 mg via INTRAVENOUS
  Filled 2012-10-21: qty 2

## 2012-10-21 NOTE — ED Provider Notes (Signed)
History     CSN: [id number]  Arrival date & time 10/21/12  2209   First MD Initiated Contact with Patient 10/21/12 2227      Chief Complaint  Patient presents with  . Chest Pain  . Alcohol Intoxication    (Consider location/radiation/quality/duration/timing/severity/associated sxs/prior treatment) HPI Comments: [name], with a history of, alcohol abuse, status post CABG in 2002, now with 3, days of intermittent right upper quadrant and left chest wall pain, nausea.  Tonight, states the pain is constant, like there's an elephant sitting on his chest.  He tried a little alcohol to help the pain, but didn't get any relief  Patient is a 36 y.o. male presenting with chest pain and intoxication. The history is provided by the patient.  Chest Pain The chest pain began 3 - 5 days ago. Chest pain occurs constantly. The chest pain is worsening. At its most intense, the pain is at 7/10. Primary symptoms include nausea. Pertinent negatives for primary symptoms include no fever, no shortness of breath, no abdominal pain and no vomiting.  Pertinent negatives for associated symptoms include no weakness.    Alcohol Intoxication Associated symptoms include chest pain and nausea. Pertinent negatives include no abdominal pain, chills, congestion, fever, rash, vomiting or weakness.    Past Medical History  Diagnosis Date  . Hypertension     not taking meds    Past Surgical History  Procedure Date  . Coronary artery bypass graft     No family history on file.  History  Substance Use Topics  . Smoking status: Current Every Day Smoker -- 1.0 packs/day for 19 years    Types: Cigarettes  . Smokeless tobacco: Not on file  . Alcohol Use: 0.0 oz/week     pt states he drinks 1 to 1.5 fifths of liquor per day      Review of Systems  Constitutional: Negative for fever and chills.  HENT: Negative for congestion.   Respiratory: Negative for shortness of breath.     Cardiovascular: Positive for chest pain.  Gastrointestinal: Positive for nausea. Negative for vomiting and abdominal pain.  Skin: Negative for rash and wound.  Neurological: Negative for weakness.    Allergies  Review of patient's allergies indicates no known allergies.  Home Medications   Current Outpatient Rx  Name Route Sig Dispense Refill  . IBUPROFEN 200 MG PO TABS Oral Take 400 mg by mouth every 6 (six) hours as needed. PAIN      Pulse 96  Temp 98.4 F (36.9 C) (Oral)  Resp 19  SpO2 97%  Physical Exam  Constitutional: He is oriented to person, place, and time. He appears well-developed.  HENT:  Head: Normocephalic.  Eyes: Pupils are equal, round, and reactive to light.  Neck: Normal range of motion.  Cardiovascular: Normal rate.   Pulmonary/Chest: Effort normal.  Abdominal: Soft. He exhibits no distension. There is no tenderness.  Musculoskeletal: Normal range of motion.  Neurological: He is alert and oriented to person, place, and time.  Skin: Skin is warm. No erythema.    ED Course  Procedures (including critical care time)   Labs Reviewed  CBC  COMPREHENSIVE METABOLIC PANEL  LIPASE, BLOOD  ETHANOL   No results found.   No diagnosis found.    MDM  Patient has been, sleeping, with no tachycardia, nausea, or vomiting until he was awakened to recheck an alcohol level, then, states he's been having pain       Ruben Filter,  NP 10/22/12 0542

## 2012-10-21 NOTE — ED Notes (Signed)
Pt states chest pain feels better but abdominal pain is still hurting.

## 2012-10-21 NOTE — ED Notes (Signed)
Pt BIB EMS. Pt c/o L sided chest wall pain for last 3 days. Pt also c/o RUQ pain. Pt states he has a hx of pancreatitis. Pt admits to drinking today. Last drink a few hours ago.  Pt has extensive hx of etoh abuse.

## 2012-10-21 NOTE — ED Notes (Signed)
Pt states his last drink was at 1600 today.

## 2012-10-21 NOTE — ED Notes (Signed)
BJY:[id number]<FA> Expected date:10/21/12<BR> Expected time: 9:52 PM<BR> Means of arrival:Ambulance<BR> Comments:<BR> Etoh; pancreatitis history; chest pain; ambulatory

## 2012-10-22 ENCOUNTER — Encounter (HOSPITAL_COMMUNITY): Payer: Self-pay | Admitting: Emergency Medicine

## 2012-10-22 LAB — POCT I-STAT TROPONIN I

## 2012-10-22 LAB — ETHANOL: Alcohol, Ethyl (B): 295 mg/dL — ABNORMAL HIGH (ref 0–11)

## 2012-10-22 MED ORDER — SODIUM CHLORIDE 0.9 % IV SOLN
Freq: Once | INTRAVENOUS | Status: AC
  Administered 2012-10-22: 20 mL/h via INTRAVENOUS

## 2012-10-22 MED ORDER — METHYLPREDNISOLONE SODIUM SUCC 125 MG IJ SOLR
125.0000 mg | Freq: Once | INTRAMUSCULAR | Status: AC
  Administered 2012-10-22: 125 mg via INTRAVENOUS
  Filled 2012-10-22: qty 2

## 2012-10-22 MED ORDER — ONDANSETRON HCL 4 MG/2ML IJ SOLN
4.0000 mg | Freq: Once | INTRAMUSCULAR | Status: AC
  Administered 2012-10-22: 4 mg via INTRAVENOUS
  Filled 2012-10-22: qty 2

## 2012-10-22 MED ORDER — FOLIC ACID 1 MG PO TABS
1.0000 mg | ORAL_TABLET | Freq: Once | ORAL | Status: AC
  Administered 2012-10-22: 1 mg via ORAL
  Filled 2012-10-22: qty 1

## 2012-10-22 MED ORDER — PREDNISONE 20 MG PO TABS: ORAL_TABLET | ORAL | Status: DC

## 2012-10-22 MED ORDER — SODIUM CHLORIDE 0.9 % IV BOLUS (SEPSIS)
1000.0000 mL | Freq: Once | INTRAVENOUS | Status: AC
  Administered 2012-10-22: 1000 mL via INTRAVENOUS

## 2012-10-22 MED ORDER — MORPHINE SULFATE 4 MG/ML IJ SOLN
4.0000 mg | Freq: Once | INTRAMUSCULAR | Status: AC
  Administered 2012-10-22: 4 mg via INTRAVENOUS
  Filled 2012-10-22: qty 1

## 2012-10-22 MED ORDER — LORAZEPAM 2 MG/ML IJ SOLN
1.0000 mg | Freq: Once | INTRAMUSCULAR | Status: AC
  Administered 2012-10-22: 1 mg via INTRAVENOUS
  Filled 2012-10-22: qty 1

## 2012-10-22 MED ORDER — VITAMIN B-12 1000 MCG PO TABS
1000.0000 ug | ORAL_TABLET | Freq: Every day | ORAL | Status: DC
Start: 2012-10-22 — End: 2012-10-22
  Administered 2012-10-22: 1000 ug via ORAL
  Filled 2012-10-22: qty 1

## 2012-10-22 MED ORDER — HYDROCODONE-ACETAMINOPHEN 5-325 MG PO TABS: 2.0000 | ORAL_TABLET | ORAL | Status: DC | PRN

## 2012-10-22 MED ORDER — VITAMIN B-6 100 MG PO TABS
100.0000 mg | ORAL_TABLET | Freq: Every day | ORAL | Status: DC
  Administered 2012-10-22: 100 mg via ORAL
  Filled 2012-10-22: qty 1

## 2012-10-22 MED ORDER — ALBUTEROL SULFATE (5 MG/ML) 0.5% IN NEBU
5.0000 mg | INHALATION_SOLUTION | Freq: Once | RESPIRATORY_TRACT | Status: AC
  Administered 2012-10-22: 5 mg via RESPIRATORY_TRACT
  Filled 2012-10-22: qty 1

## 2012-10-22 MED ORDER — ALBUTEROL SULFATE (5 MG/ML) 0.5% IN NEBU: 2.5000 mg | INHALATION_SOLUTION | RESPIRATORY_TRACT | Status: DC

## 2012-10-22 MED ORDER — ALBUTEROL SULFATE HFA 108 (90 BASE) MCG/ACT IN AERS
2.0000 | INHALATION_SPRAY | Freq: Once | RESPIRATORY_TRACT | Status: AC
  Administered 2012-10-22: 2 via RESPIRATORY_TRACT
  Filled 2012-10-22: qty 6.7

## 2012-10-22 MED ORDER — IPRATROPIUM BROMIDE 0.02 % IN SOLN: 0.5000 mg | RESPIRATORY_TRACT | Status: DC

## 2012-10-22 NOTE — ED Notes (Signed)
Act team to see pt

## 2012-10-22 NOTE — ED Provider Notes (Signed)
8:35 AM BP 119/56  Pulse 86  Temp 98.1 F (36.7 C) (Oral)  Resp 15  SpO2 91% Assumed care of the patient from NP [facility]. Patient presented with intoxication  etoh level in 400s and chest pain.  Troponin and ekg negative last night.  Last etoh was 295. Lipase 75 Patient c/o abdominal pain. He states that he can "feel the shakes." coming on.  Patient with AST:ALT  >2:1.   CV: RRR, No M/R/G, Peripheral pulses intact. No peripheral edema. Lungs:  Wheezes Abd: Soft, ttp epigastrum Neuro: A&O x3, pupils ERRL.  Mild tremors. feelng anxious Skin: Flushed   Giving patient neb treatment. Repeat troponin.  Patient requests help with detox.  I have put in act consult. O2 sats dropped to 91%. No signs of pneumonia on chest xray.    9:31 AM BP 119/56  Pulse 86  Temp 98.1 F (36.7 C) (Oral)  Resp 15  SpO2 95% Patient finished albuterol neb. O2 SATs are 97% on RA on monitor. When patient gets sleepy sats drop to 92%. Wheezes throughout all lung fields and rhonchi that clear with cough.  Patient will get IV solumedrol awaiting ETOH. Shakes are relieved with ativan.   11:22 AM Patient still wheezing with prolonged expiratory phase after albuterol neb and solumedrol 125. C/o abdominal pain.  etoh 241.  ACT team has not returned page.  WIll get etoh level, give duoneb.    12:32 PM ETOh level below 200. Stable and o2 sats 97 on rom air, decreased wheezing. D/C with pain control and prednisone and albuterol.  ACT team has given info on rehab options. Patient declines inpatient detox.  Arthor Captain, PA-C 10/22/12 0981  Arthor Captain, PA-C 10/22/12 1240

## 2012-10-22 NOTE — ED Notes (Signed)
Pt alert and oriented x4. Respirations even and unlabored, bilateral symmetrical rise and fall of chest. Skin warm and dry. In no acute distress. Denies needs.   

## 2012-10-22 NOTE — ED Provider Notes (Signed)
Medical screening examination/treatment/procedure(s) were performed by non-physician practitioner and as supervising physician I was immediately available for consultation/collaboration.  Sunnie Nielsen, MD 10/22/12 [phone]

## 2012-10-22 NOTE — ED Provider Notes (Signed)
Medical screening examination/treatment/procedure(s) were performed by non-physician practitioner and as supervising physician I was immediately available for consultation/collaboration.  Adraine Biffle, MD 10/22/12 1706 

## 2012-10-22 NOTE — ED Notes (Signed)
Bed:WA09<BR> Expected date:<BR> Expected time:<BR> Means of arrival:<BR> Comments:<BR>

## 2012-10-22 NOTE — BH Assessment (Signed)
Assessment Note   Ruben Sutton is an 36 y.o. male. Pt presenting with Alcohol Abuse/Dependence. Pt reports binging, but heavily when he binges. Stated "I can go months at a time without drinking" but binges. Pt stated this past 3 days he drank 2 1/5ths of liquor daily. Reports longest period of sobriety was "a couple of years". Pt reports a decline in sleep but no changes in eating. Denies hx of abuse. Does report some w/d symptoms. Pt denies SI, HI or psychosis. Pt denies previous SA IPT or OPT but did have a medical detox in May 2013 at [facility]. Pt denied offer for placement for detox and only wanted info regarding OPT services. Discussed options with pt and explained needs. Informed pt that if his w/d sx do worsen he may return for treatment. Discussed with EDP, RN and PA. Pt provided with OPT resources.  Axis I: Alcohol Dependence Axis II: Deferred Axis III:  Past Medical History  Diagnosis Date  . Hypertension     not taking meds   Axis IV: problems related to social environment Axis V: 41-50 serious symptoms  Past Medical History:  Past Medical History  Diagnosis Date  . Hypertension     not taking meds    Past Surgical History  Procedure Date  . Coronary artery bypass graft     Family History: History reviewed. No pertinent family history.  Social History:  reports that he has been smoking Cigarettes.  He has a 19 pack-year smoking history. He does not have any smokeless tobacco history on file. He reports that he drinks alcohol. He reports that he does not use illicit drugs.  Additional Social History:  Alcohol / Drug Use Pain Medications: N/A Prescriptions: See PTA List Over the Counter: N/A History of alcohol / drug use?: Yes Longest period of sobriety (when/how long): "I don't know...a couple of years maybe" Withdrawal Symptoms: Nausea / Vomiting;Sweats;Tremors;Agitation Substance #1 Name of Substance 1: ETOH 1 - Age of First Use: 14 or 15 1 - Amount (size/oz): 2  1/5 bottles of liquor 1 - Frequency: daily 1 - Duration: 3 days - but reports he heavily binges 1 - Last Use / Amount: 10/21/12  CIWA: CIWA-Ar BP: 141/83 mmHg Pulse Rate: 98  Nausea and Vomiting: mild nausea with no vomiting Tactile Disturbances: very mild itching, pins and needles, burning or numbness Tremor: two Auditory Disturbances: very mild harshness or ability to frighten Paroxysmal Sweats: barely perceptible sweating, palms moist Visual Disturbances: mild sensitivity Anxiety: moderately anxious, or guarded, so anxiety is inferred Headache, Fullness in Head: mild Agitation: somewhat more than normal activity Orientation and Clouding of Sensorium: oriented and can do serial additions CIWA-Ar Total: 15  COWS:    Allergies: No Known Allergies  Home Medications:  (Not in a hospital admission)  OB/GYN Status:  No LMP for male patient.  General Assessment Data Location of Assessment: [facility] ED Can pt return to current living arrangement?: Yes Admission Status: Voluntary Is patient capable of signing voluntary admission?: Yes Transfer from: Acute Hospital Referral Source: Self/Family/Friend  Education Status Is patient currently in school?: No  Risk to self Suicidal Ideation: No (Pt denies) Suicidal Intent: No Is patient at risk for suicide?: No Suicidal Plan?: No Access to Means: No What has been your use of drugs/alcohol within the last 12 months?: heavily binges - reports most recent of 2 1/5 of liquor daily for past 3 days Previous Attempts/Gestures: No (Pt denies) How many times?: 0  Other Self Harm Risks:  None Triggers for Past Attempts: None known Intentional Self Injurious Behavior: None Family Suicide History: Unknown Recent stressful life event(s): Other (Comment) (pt denies) Persecutory voices/beliefs?: No Depression: No (Pt denies) Substance abuse history and/or treatment for substance abuse?: Yes Suicide prevention information given to non-admitted  patients: Not applicable  Risk to Others Homicidal Ideation: No (Pt denies) Thoughts of Harm to Others: No Current Homicidal Intent: No Current Homicidal Plan: No Access to Homicidal Means: No Identified Victim: None History of harm to others?: No Assessment of Violence: None Noted Does patient have access to weapons?: No (Pt denies) Criminal Charges Pending?: No Does patient have a court date: No  Psychosis Hallucinations: None noted Delusions: None noted  Mental Status Report Appear/Hygiene: Disheveled Eye Contact: Good Motor Activity: Restlessness Speech: Logical/coherent Level of Consciousness: Restless Mood: Anxious Affect: Anxious Anxiety Level: Minimal Thought Processes: Coherent;Relevant Judgement: Unimpaired Orientation: Person;Place;Time;Situation Obsessive Compulsive Thoughts/Behaviors: None  Cognitive Functioning Concentration: Normal Memory: Recent Intact;Remote Intact IQ: Average Insight: Poor Impulse Control: Fair Appetite: Good Weight Loss: 0  Weight Gain: 0  Sleep: Decreased Total Hours of Sleep: 6  Vegetative Symptoms: None  ADLScreening [name] Patient's cognitive ability adequate to safely complete daily activities?: Yes Patient able to express need for assistance with ADLs?: Yes Independently performs ADLs?: Yes (appropriate for developmental age)  Abuse/Neglect [facility]) Physical Abuse: Denies Verbal Abuse: Denies Sexual Abuse: Denies  Prior Inpatient Therapy Prior Inpatient Therapy: Yes Prior Therapy Dates: May 2013 Prior Therapy Facilty/Provider(s): [facility] medical detox Reason for Treatment: Detox - ETOH  Prior Outpatient Therapy Prior Outpatient Therapy: No (Pt denies)  ADL Screening (condition at time of admission) Patient's cognitive ability adequate to safely complete daily activities?: Yes Patient able to express need for assistance with ADLs?: Yes Independently performs ADLs?: Yes (appropriate for  developmental age)       Abuse/Neglect Assessment (Assessment to be complete while patient is alone) Physical Abuse: Denies Verbal Abuse: Denies Sexual Abuse: Denies Values / Beliefs Cultural Requests During Hospitalization: None Spiritual Requests During Hospitalization: None   Advance Directives (For Healthcare) Advance Directive: Patient does not have advance directive;Patient would not like information Pre-existing out of facility DNR order (yellow form or pink MOST form): No    Additional Information 1:1 In Past 12 Months?: No CIRT Risk: No Elopement Risk: No Does patient have medical clearance?: Yes     Disposition:  Disposition Disposition of Patient: Outpatient treatment;Referred to (Pt given CD/SA OPT resources) Patient referred to: Other (Comment) (Pt given CD/SA OPT resources)  On Site Evaluation by:   Reviewed with Physician:     Ruben Sutton 10/22/2012 12:56 PM

## 2012-12-24 DIAGNOSIS — S92909A Unspecified fracture of unspecified foot, initial encounter for closed fracture: Secondary | ICD-10-CM

## 2012-12-24 HISTORY — DX: Unspecified fracture of unspecified foot, initial encounter for closed fracture: S92.909A

## 2013-02-25 ENCOUNTER — Encounter (HOSPITAL_COMMUNITY): Payer: Self-pay | Admitting: *Deleted

## 2013-02-25 ENCOUNTER — Emergency Department (HOSPITAL_COMMUNITY)
Admission: EM | Admit: 2013-02-25 | Discharge: 2013-02-25 | Disposition: A | Discharge status: Home / Self Care | Payer: Medicaid Other | Attending: Emergency Medicine | Admitting: Emergency Medicine

## 2013-02-25 ENCOUNTER — Emergency Department (HOSPITAL_COMMUNITY): Payer: Medicaid Other

## 2013-02-25 DIAGNOSIS — Y939 Activity, unspecified: Secondary | ICD-10-CM | POA: Insufficient documentation

## 2013-02-25 DIAGNOSIS — F17200 Nicotine dependence, unspecified, uncomplicated: Secondary | ICD-10-CM | POA: Insufficient documentation

## 2013-02-25 DIAGNOSIS — S8263XA Displaced fracture of lateral malleolus of unspecified fibula, initial encounter for closed fracture: Secondary | ICD-10-CM | POA: Insufficient documentation

## 2013-02-25 DIAGNOSIS — I10 Essential (primary) hypertension: Secondary | ICD-10-CM | POA: Insufficient documentation

## 2013-02-25 DIAGNOSIS — S8261XA Displaced fracture of lateral malleolus of right fibula, initial encounter for closed fracture: Secondary | ICD-10-CM

## 2013-02-25 DIAGNOSIS — W108XXA Fall (on) (from) other stairs and steps, initial encounter: Secondary | ICD-10-CM | POA: Insufficient documentation

## 2013-02-25 DIAGNOSIS — Y929 Unspecified place or not applicable: Secondary | ICD-10-CM | POA: Insufficient documentation

## 2013-02-25 MED ORDER — IBUPROFEN 600 MG PO TABS: 600.0000 mg | ORAL_TABLET | Freq: Four times a day (QID) | ORAL | Status: DC | PRN

## 2013-02-25 MED ORDER — HYDROCODONE-ACETAMINOPHEN 5-325 MG PO TABS: 1.0000 | ORAL_TABLET | Freq: Four times a day (QID) | ORAL | Status: DC | PRN

## 2013-02-25 MED ORDER — OXYCODONE-ACETAMINOPHEN 5-325 MG PO TABS
1.0000 | ORAL_TABLET | Freq: Once | ORAL | Status: AC
  Administered 2013-02-25: 1 via ORAL
  Filled 2013-02-25: qty 1

## 2013-02-25 NOTE — ED Provider Notes (Signed)
History     CSN: [id number]  Arrival date & time 02/25/13  1116   First MD Initiated Contact with Patient 02/25/13 1227      Chief Complaint  Patient presents with  . Ankle Pain    (Consider location/radiation/quality/duration/timing/severity/associated sxs/prior treatment) HPI Ruben Sutton is a 37 y.o. male who presented to ED with complaint of fall and injury to the right ankle. States fell while going up the stairs. Right ankle bruising, swelling. States not improving. Hx of right ankle fracture x2 in the past, no surgical repair. No numbness or weakness int he foot. Took ibuprofen with no relief.    Past Medical History  Diagnosis Date  . Hypertension     not taking meds    Past Surgical History  Procedure Laterality Date  . Coronary artery bypass graft      History reviewed. No pertinent family history.  History  Substance Use Topics  . Smoking status: Current Every Day Smoker -- 1.00 packs/day for 19 years    Types: Cigarettes  . Smokeless tobacco: Not on file  . Alcohol Use: 0.0 oz/week     Comment: pt states he drinks 1 to 1.5 fifths of liquor per day      Review of Systems  Constitutional: Negative for fever and chills.  Respiratory: Negative.   Cardiovascular: Negative.   Musculoskeletal: Positive for joint swelling and arthralgias.  Skin: Positive for color change.  Neurological: Negative for weakness and numbness.    Allergies  Review of patient's allergies indicates no known allergies.  Home Medications  No current outpatient prescriptions on file.  BP 172/106  Pulse 103  Temp(Src) 98.3 F (36.8 C) (Oral)  Resp 18  SpO2 99%  Physical Exam  Nursing note and vitals reviewed. Constitutional: He is oriented to person, place, and time. He appears well-developed and well-nourished. No distress.  Cardiovascular: Normal rate, regular rhythm and normal heart sounds.   Pulmonary/Chest: Effort normal and breath sounds normal. No respiratory  distress. He has no wheezes. He has no rales.  Musculoskeletal:  Swelling over lateral right ankle, extensive bruising. Achilles tendon intact. tenderness over lateral malleolus and surrounding soft tissue. Pain with any ROM of the ankle. Dorsal pedal pulses normal.     Neurological: He is alert and oriented to person, place, and time.  Skin: Skin is warm and dry.    ED Course  Procedures (including critical care time)  Labs Reviewed - No data to display Dg Ankle Complete Right  02/25/2013  *RADIOLOGY REPORT*  Clinical Data: Twisted right ankle 4 days ago, fall, pain, swelling and bruising, old fracture 1 year ago  RIGHT ANKLE - COMPLETE 3+ VIEW  Comparison: 04/09/2029  Findings: Ankle mortise intact. Deformity of the distal fibula post healed fracture. Osseous mineralization grossly normal. Lateral soft tissue swelling. On the lateral view, a sharply defined linear lucency is seen at the lateral malleolus, uncertain if represents sequela of prior lateral malleolar fracture versus nondisplaced recurrent acute fracture. No additional potential fracture or dislocation seen.  IMPRESSION: Post-traumatic deformity distal right fibula. Transverse linear lucency at the lateral malleolus, sharply defined, question sequela of previous fracture versus nondisplaced recurrent acute fracture.   Original Report Authenticated By: Ulyses Southward, M.D.      1. Ankle fracture, lateral malleolus, closed, right, initial encounter       MDM  PT with acute non displaced recurrent fracture of the lateral malleolus. Placed in a CAM [place] per his request. Pt states he cannot walk  on crutches for too long. Crutches provided. Will follow up with his orthopedist.           Lottie Mussel, PA-C 02/25/13 [phone]

## 2013-02-25 NOTE — Progress Notes (Signed)
Orthopedic Tech Progress Note Patient Details:  Ruben Sutton 06-21-1976 [id number]  Ortho Devices Type of Ortho Device: Crutches;CAM [place] Ortho Device/Splint Location: RIGHT CAM [place] CRUTCHES Ortho Device/Splint Interventions: Application   Cammer, Mickie Bail 02/25/2013, 1:25 PM

## 2013-02-25 NOTE — ED Notes (Signed)
Reports twisting his right ankle on Sunday, having pain, swelling and bruising since. Hx of right ankle fx, pt ambulatory at triage.

## 2013-02-25 NOTE — ED Notes (Signed)
Patient transported to X-ray 

## 2013-03-02 NOTE — ED Provider Notes (Signed)
Medical screening examination/treatment/procedure(s) were performed by non-physician practitioner and as supervising physician I was immediately available for consultation/collaboration.   Kevin E Steinl, MD 03/02/13 0745 

## 2013-03-07 ENCOUNTER — Emergency Department (HOSPITAL_COMMUNITY): Payer: Medicaid Other

## 2013-03-07 ENCOUNTER — Encounter (HOSPITAL_COMMUNITY): Payer: Self-pay | Admitting: *Deleted

## 2013-03-07 ENCOUNTER — Observation Stay (HOSPITAL_COMMUNITY): Payer: Medicaid Other

## 2013-03-07 ENCOUNTER — Inpatient Hospital Stay (HOSPITAL_COMMUNITY)
Admission: EM | Admit: 2013-03-07 | Discharge: 2013-03-10 | DRG: 202 | Disposition: A | Discharge status: Home / Self Care | Payer: Medicaid Other | Attending: Internal Medicine | Admitting: Internal Medicine

## 2013-03-07 DIAGNOSIS — W208XXA Other cause of strike by thrown, projected or falling object, initial encounter: Secondary | ICD-10-CM | POA: Diagnosis present

## 2013-03-07 DIAGNOSIS — Z23 Encounter for immunization: Secondary | ICD-10-CM

## 2013-03-07 DIAGNOSIS — R0789 Other chest pain: Secondary | ICD-10-CM | POA: Diagnosis present

## 2013-03-07 DIAGNOSIS — R7401 Elevation of levels of liver transaminase levels: Secondary | ICD-10-CM | POA: Diagnosis present

## 2013-03-07 DIAGNOSIS — S92302A Fracture of unspecified metatarsal bone(s), left foot, initial encounter for closed fracture: Secondary | ICD-10-CM

## 2013-03-07 DIAGNOSIS — F1010 Alcohol abuse, uncomplicated: Secondary | ICD-10-CM | POA: Diagnosis present

## 2013-03-07 DIAGNOSIS — R079 Chest pain, unspecified: Secondary | ICD-10-CM

## 2013-03-07 DIAGNOSIS — F10239 Alcohol dependence with withdrawal, unspecified: Secondary | ICD-10-CM | POA: Diagnosis present

## 2013-03-07 DIAGNOSIS — S92309A Fracture of unspecified metatarsal bone(s), unspecified foot, initial encounter for closed fracture: Secondary | ICD-10-CM

## 2013-03-07 DIAGNOSIS — T50995A Adverse effect of other drugs, medicaments and biological substances, initial encounter: Secondary | ICD-10-CM | POA: Diagnosis present

## 2013-03-07 DIAGNOSIS — I10 Essential (primary) hypertension: Secondary | ICD-10-CM

## 2013-03-07 DIAGNOSIS — E876 Hypokalemia: Secondary | ICD-10-CM | POA: Diagnosis present

## 2013-03-07 DIAGNOSIS — F1020 Alcohol dependence, uncomplicated: Secondary | ICD-10-CM | POA: Diagnosis present

## 2013-03-07 DIAGNOSIS — J209 Acute bronchitis, unspecified: Principal | ICD-10-CM | POA: Diagnosis present

## 2013-03-07 DIAGNOSIS — R7402 Elevation of levels of lactic acid dehydrogenase (LDH): Secondary | ICD-10-CM | POA: Diagnosis present

## 2013-03-07 DIAGNOSIS — F10939 Alcohol use, unspecified with withdrawal, unspecified: Secondary | ICD-10-CM | POA: Diagnosis present

## 2013-03-07 DIAGNOSIS — R112 Nausea with vomiting, unspecified: Secondary | ICD-10-CM | POA: Diagnosis not present

## 2013-03-07 DIAGNOSIS — F17200 Nicotine dependence, unspecified, uncomplicated: Secondary | ICD-10-CM

## 2013-03-07 LAB — HEPATIC FUNCTION PANEL
ALT: 70 U/L — ABNORMAL HIGH (ref 0–53)
AST: 91 U/L — ABNORMAL HIGH (ref 0–37)
Albumin: 3.4 g/dL — ABNORMAL LOW (ref 3.5–5.2)
Alkaline Phosphatase: 108 U/L (ref 39–117)
Bilirubin, Direct: <0.1 mg/dL (ref 0.0–0.3)
Total Bilirubin: 0.4 mg/dL (ref 0.3–1.2)

## 2013-03-07 LAB — CBC
HCT: 42.9 % (ref 39.0–52.0)
Hemoglobin: 15.2 g/dL (ref 13.0–17.0)
MCH: 34.5 pg — ABNORMAL HIGH (ref 26.0–34.0)
MCHC: 35.4 g/dL (ref 30.0–36.0)
MCV: 97.5 fL (ref 78.0–100.0)

## 2013-03-07 LAB — BASIC METABOLIC PANEL
BUN: 5 mg/dL — ABNORMAL LOW (ref 6–23)
CO2: 21 mEq/L (ref 19–32)
Calcium: 8.7 mg/dL (ref 8.4–10.5)
Calcium: 8.7 mg/dL (ref 8.4–10.5)
Creatinine, Ser: 0.78 mg/dL (ref 0.50–1.35)
GFR calc non Af Amer: >90 mL/min (ref >90)
Glucose, Bld: 95 mg/dL (ref 70–99)
Glucose, Bld: 79 mg/dL (ref 70–99)
Potassium: 3.3 mEq/L — ABNORMAL LOW (ref 3.5–5.1)
Sodium: 134 mEq/L — ABNORMAL LOW (ref 135–145)

## 2013-03-07 LAB — TROPONIN I: Troponin I: <0.3 ng/mL (ref <0.30)

## 2013-03-07 MED ORDER — ALBUTEROL SULFATE (5 MG/ML) 0.5% IN NEBU
2.5000 mg | INHALATION_SOLUTION | Freq: Four times a day (QID) | RESPIRATORY_TRACT | Status: DC
  Administered 2013-03-07: 2.5 mg via RESPIRATORY_TRACT
  Filled 2013-03-07: qty 0.5

## 2013-03-07 MED ORDER — THIAMINE HCL 100 MG/ML IJ SOLN
100.0000 mg | Freq: Once | INTRAMUSCULAR | Status: AC
  Administered 2013-03-07: 100 mg via INTRAVENOUS
  Filled 2013-03-07: qty 2

## 2013-03-07 MED ORDER — PNEUMOCOCCAL VAC POLYVALENT 25 MCG/0.5ML IJ INJ
0.5000 mL | INJECTION | INTRAMUSCULAR | Status: AC
  Administered 2013-03-08: 0.5 mL via INTRAMUSCULAR
  Filled 2013-03-07: qty 0.5

## 2013-03-07 MED ORDER — NICOTINE 14 MG/24HR TD PT24
14.0000 mg | MEDICATED_PATCH | Freq: Every day | TRANSDERMAL | Status: DC
  Administered 2013-03-07 – 2013-03-10 (×4): 14 mg via TRANSDERMAL
  Filled 2013-03-07 (×4): qty 1

## 2013-03-07 MED ORDER — MORPHINE SULFATE 4 MG/ML IJ SOLN
4.0000 mg | Freq: Once | INTRAMUSCULAR | Status: AC
  Administered 2013-03-07: 4 mg via INTRAVENOUS
  Filled 2013-03-07: qty 1

## 2013-03-07 MED ORDER — VITAMIN B-1 100 MG PO TABS
100.0000 mg | ORAL_TABLET | Freq: Every day | ORAL | Status: DC
  Administered 2013-03-07 – 2013-03-10 (×4): 100 mg via ORAL
  Filled 2013-03-07 (×4): qty 1

## 2013-03-07 MED ORDER — PANTOPRAZOLE SODIUM 40 MG PO TBEC
40.0000 mg | DELAYED_RELEASE_TABLET | Freq: Every day | ORAL | Status: DC
  Administered 2013-03-07 – 2013-03-10 (×4): 40 mg via ORAL
  Filled 2013-03-07 (×4): qty 1

## 2013-03-07 MED ORDER — ACETAMINOPHEN 325 MG PO TABS: 650.0000 mg | ORAL_TABLET | Freq: Four times a day (QID) | ORAL | Status: DC | PRN

## 2013-03-07 MED ORDER — THIAMINE HCL 100 MG/ML IJ SOLN
100.0000 mg | Freq: Every day | INTRAMUSCULAR | Status: DC
  Filled 2013-03-07 (×2): qty 1

## 2013-03-07 MED ORDER — LORAZEPAM 1 MG PO TABS
1.0000 mg | ORAL_TABLET | Freq: Four times a day (QID) | ORAL | Status: AC | PRN
  Administered 2013-03-09 (×2): 1 mg via ORAL
  Filled 2013-03-07 (×2): qty 1

## 2013-03-07 MED ORDER — POTASSIUM CHLORIDE CRYS ER 20 MEQ PO TBCR
40.0000 meq | EXTENDED_RELEASE_TABLET | Freq: Once | ORAL | Status: AC
  Administered 2013-03-07: 40 meq via ORAL
  Filled 2013-03-07: qty 2

## 2013-03-07 MED ORDER — ENOXAPARIN SODIUM 40 MG/0.4ML ~~LOC~~ SOLN
40.0000 mg | SUBCUTANEOUS | Status: DC
  Administered 2013-03-07 – 2013-03-09 (×3): 40 mg via SUBCUTANEOUS
  Filled 2013-03-07 (×4): qty 0.4

## 2013-03-07 MED ORDER — HYDROMORPHONE HCL PF 1 MG/ML IJ SOLN
0.5000 mg | INTRAMUSCULAR | Status: DC | PRN
  Administered 2013-03-07: 1 mg via INTRAVENOUS
  Filled 2013-03-07: qty 1

## 2013-03-07 MED ORDER — ONDANSETRON HCL 4 MG PO TABS: 4.0000 mg | ORAL_TABLET | Freq: Four times a day (QID) | ORAL | Status: DC | PRN

## 2013-03-07 MED ORDER — ENOXAPARIN SODIUM 100 MG/ML ~~LOC~~ SOLN
95.0000 mg | Freq: Once | SUBCUTANEOUS | Status: AC
  Administered 2013-03-07: 95 mg via SUBCUTANEOUS
  Filled 2013-03-07: qty 1

## 2013-03-07 MED ORDER — ALBUTEROL SULFATE (5 MG/ML) 0.5% IN NEBU
2.5000 mg | INHALATION_SOLUTION | Freq: Three times a day (TID) | RESPIRATORY_TRACT | Status: DC
  Administered 2013-03-07 – 2013-03-10 (×9): 2.5 mg via RESPIRATORY_TRACT
  Filled 2013-03-07 (×10): qty 0.5

## 2013-03-07 MED ORDER — ZOLPIDEM TARTRATE 5 MG PO TABS: 5.0000 mg | ORAL_TABLET | Freq: Every evening | ORAL | Status: DC | PRN

## 2013-03-07 MED ORDER — LORAZEPAM 2 MG/ML IJ SOLN
0.0000 mg | Freq: Four times a day (QID) | INTRAMUSCULAR | Status: AC
  Administered 2013-03-07: 2 mg via INTRAVENOUS
  Administered 2013-03-08: 1 mg via INTRAVENOUS
  Administered 2013-03-08 (×2): 2 mg via INTRAVENOUS
  Administered 2013-03-08: 1 mg via INTRAVENOUS
  Filled 2013-03-07 (×5): qty 1

## 2013-03-07 MED ORDER — LORAZEPAM 2 MG/ML IJ SOLN
1.0000 mg | Freq: Once | INTRAMUSCULAR | Status: AC
  Administered 2013-03-07: 1 mg via INTRAVENOUS
  Filled 2013-03-07: qty 1

## 2013-03-07 MED ORDER — FOLIC ACID 1 MG PO TABS
1.0000 mg | ORAL_TABLET | Freq: Every day | ORAL | Status: DC
  Administered 2013-03-07 – 2013-03-10 (×4): 1 mg via ORAL
  Filled 2013-03-07 (×4): qty 1

## 2013-03-07 MED ORDER — LORAZEPAM 2 MG/ML IJ SOLN
0.0000 mg | Freq: Two times a day (BID) | INTRAMUSCULAR | Status: DC
  Administered 2013-03-09: 2 mg via INTRAVENOUS
  Administered 2013-03-09: 1 mg via INTRAVENOUS
  Filled 2013-03-07 (×2): qty 1

## 2013-03-07 MED ORDER — MORPHINE SULFATE 4 MG/ML IJ SOLN
6.0000 mg | Freq: Once | INTRAMUSCULAR | Status: AC
  Administered 2013-03-07: 6 mg via INTRAVENOUS
  Filled 2013-03-07: qty 2

## 2013-03-07 MED ORDER — METOPROLOL TARTRATE 1 MG/ML IV SOLN
5.0000 mg | INTRAVENOUS | Status: DC | PRN
  Administered 2013-03-07: 5 mg via INTRAVENOUS
  Filled 2013-03-07: qty 5

## 2013-03-07 MED ORDER — CLOPIDOGREL BISULFATE 300 MG PO TABS
300.0000 mg | ORAL_TABLET | Freq: Once | ORAL | Status: AC
  Administered 2013-03-07: 300 mg via ORAL
  Filled 2013-03-07: qty 1

## 2013-03-07 MED ORDER — SODIUM CHLORIDE 0.9 % IJ SOLN
3.0000 mL | Freq: Two times a day (BID) | INTRAMUSCULAR | Status: DC
  Administered 2013-03-07 – 2013-03-08 (×2): 3 mL via INTRAVENOUS

## 2013-03-07 MED ORDER — PREDNISONE 50 MG PO TABS
60.0000 mg | ORAL_TABLET | Freq: Two times a day (BID) | ORAL | Status: DC
  Administered 2013-03-07 – 2013-03-10 (×6): 60 mg via ORAL
  Filled 2013-03-07 (×8): qty 1

## 2013-03-07 MED ORDER — ADULT MULTIVITAMIN W/MINERALS CH
1.0000 | ORAL_TABLET | Freq: Every day | ORAL | Status: DC
  Administered 2013-03-07 – 2013-03-10 (×4): 1 via ORAL
  Filled 2013-03-07 (×4): qty 1

## 2013-03-07 MED ORDER — ASPIRIN EC 325 MG PO TBEC
325.0000 mg | DELAYED_RELEASE_TABLET | Freq: Every day | ORAL | Status: DC
  Administered 2013-03-07 – 2013-03-08 (×2): 325 mg via ORAL
  Filled 2013-03-07 (×2): qty 1

## 2013-03-07 MED ORDER — ACETAMINOPHEN 650 MG RE SUPP: 650.0000 mg | Freq: Four times a day (QID) | RECTAL | Status: DC | PRN

## 2013-03-07 MED ORDER — NITROGLYCERIN 2 % TD OINT
1.0000 [in_us] | TOPICAL_OINTMENT | Freq: Four times a day (QID) | TRANSDERMAL | Status: DC
  Administered 2013-03-07 – 2013-03-08 (×5): 1 [in_us] via TOPICAL
  Filled 2013-03-07: qty 30

## 2013-03-07 MED ORDER — IPRATROPIUM BROMIDE 0.02 % IN SOLN
0.5000 mg | Freq: Three times a day (TID) | RESPIRATORY_TRACT | Status: DC
  Administered 2013-03-07 – 2013-03-10 (×9): 0.5 mg via RESPIRATORY_TRACT
  Filled 2013-03-07 (×9): qty 2.5

## 2013-03-07 MED ORDER — INFLUENZA VIRUS VACC SPLIT PF IM SUSP
0.5000 mL | INTRAMUSCULAR | Status: AC
  Administered 2013-03-08: 0.5 mL via INTRAMUSCULAR
  Filled 2013-03-07: qty 0.5

## 2013-03-07 MED ORDER — ALUM & MAG HYDROXIDE-SIMETH 200-200-20 MG/5ML PO SUSP: 30.0000 mL | Freq: Four times a day (QID) | ORAL | Status: DC | PRN

## 2013-03-07 MED ORDER — ONDANSETRON HCL 4 MG/2ML IJ SOLN
4.0000 mg | Freq: Four times a day (QID) | INTRAMUSCULAR | Status: DC | PRN
  Administered 2013-03-07 – 2013-03-08 (×2): 4 mg via INTRAVENOUS
  Filled 2013-03-07 (×2): qty 2

## 2013-03-07 MED ORDER — LEVOFLOXACIN 500 MG PO TABS
500.0000 mg | ORAL_TABLET | Freq: Every day | ORAL | Status: DC
  Administered 2013-03-07 – 2013-03-10 (×4): 500 mg via ORAL
  Filled 2013-03-07 (×4): qty 1

## 2013-03-07 MED ORDER — SODIUM CHLORIDE 0.9 % IV SOLN
INTRAVENOUS | Status: DC
  Administered 2013-03-07 – 2013-03-09 (×4): via INTRAVENOUS

## 2013-03-07 MED ORDER — LORAZEPAM 2 MG/ML IJ SOLN
1.0000 mg | Freq: Four times a day (QID) | INTRAMUSCULAR | Status: AC | PRN
  Administered 2013-03-07 – 2013-03-08 (×2): 1 mg via INTRAVENOUS
  Filled 2013-03-07 (×2): qty 1

## 2013-03-07 MED ORDER — LORAZEPAM 2 MG/ML IJ SOLN
1.0000 mg | INTRAMUSCULAR | Status: DC | PRN
  Administered 2013-03-07 – 2013-03-08 (×2): 1 mg via INTRAVENOUS
  Filled 2013-03-07 (×2): qty 1

## 2013-03-07 MED ORDER — OXYCODONE HCL 5 MG PO TABS
5.0000 mg | ORAL_TABLET | ORAL | Status: DC | PRN
  Administered 2013-03-07 – 2013-03-10 (×12): 5 mg via ORAL
  Filled 2013-03-07 (×12): qty 1

## 2013-03-07 MED ORDER — IOHEXOL 350 MG/ML SOLN
100.0000 mL | Freq: Once | INTRAVENOUS | Status: AC | PRN
  Administered 2013-03-07: 100 mL via INTRAVENOUS

## 2013-03-07 NOTE — ED Provider Notes (Signed)
History     CSN: [id number]  Arrival date & time 03/07/13  0055   First MD Initiated Contact with Patient 03/07/13 [phone]      Chief Complaint  Patient presents with  . Chest Pain  . Foot Pain    (Consider location/radiation/quality/duration/timing/severity/associated sxs/prior treatment) HPI      Past Medical History  Diagnosis Date  . Hypertension     not taking meds    Past Surgical History  Procedure Laterality Date  . Coronary artery bypass graft      No family history on file.  History  Substance Use Topics  . Smoking status: Current Every Day Smoker -- 1.00 packs/day for 19 years    Types: Cigarettes  . Smokeless tobacco: Not on file  . Alcohol Use: 0.0 oz/week     Comment: pt states he drinks 1 to 1.5 fifths of liquor per day      Review of Systems  Gen: no weight loss, fevers, chills, night sweats Eyes: no discharge or drainage, no occular pain or visual changes Nose: no epistaxis or rhinorrhea Mouth: no dental pain, + sore throat Neck: no neck pain Lungs: As per history of present illness, otherwise negative CV: As per history of present illness, otherwise negative Abd: no abdominal pain, nausea, vomiting GU: no dysuria or gross hematuria MSK: As per history of present illness, otherwise negative Neuro: no headache, no focal neurologic deficits Skin: no rash Psyche: negative.  Allergies  Review of patient's allergies indicates no known allergies.  Home Medications   Current Outpatient Rx  Name  Route  Sig  Dispense  Refill  . HYDROcodone-acetaminophen (NORCO) 5-325 MG per tablet   Oral   Take 1 tablet by mouth every 6 (six) hours as needed for pain.   30 tablet   0   . ibuprofen (ADVIL,MOTRIN) 600 MG tablet   Oral   Take 1 tablet (600 mg total) by mouth every 6 (six) hours as needed for pain.   30 tablet   0     BP 121/78  Pulse 105  Temp(Src) 98.1 F (36.7 C) (Oral)  Resp 23  SpO2 93%  Physical Exam  Gen: well  developed and well nourished appearing, obese, foul tempered Head: NCAT Eyes: PERL, EOMI Nose: no epistaixis or rhinorrhea Mouth/throat: mucosa is moist and pink Neck: supple, no stridor Lungs: CTA B, no wheezing, rhonchi or rales CV: rapid and regular, pulse 104 bpm, no murmur, good cap refill Abd: soft, notender, nondistended, obese Back: no ttp, no cva ttp Skin: no rashese, wnl Neuro: CN ii-xii grossly intact, no focal deficits Psyche; normal affect,  calm and cooperative.  EXT: left foot: edematous with echymosis dorsal aspect and exquisite ttp over the metatarsals, pt can wiggle toes, sensation is intact, cap refill < 2s, ankle, lower leg, knee, thigh nontender.  Right foot - ttp over the lateral aspect of mid foot, ankle ttp laterally, lower leg, knee, thigh nontender, NV intact.   ED Course  Procedures (including critical care time)  Results for orders placed during the hospital encounter of 03/07/13 (from the past 24 hour(s))  CBC     Status: Abnormal   Collection Time    03/07/13  1:12 AM      Result Value Range   WBC 7.0  4.0 - 10.5 K/uL   RBC 4.40  4.22 - 5.81 MIL/uL   Hemoglobin 15.2  13.0 - 17.0 g/dL   HCT 11.9  14.7 - 82.9 %   MCV  97.5  78.0 - 100.0 fL   MCH 34.5 (*) 26.0 - 34.0 pg   MCHC 35.4  30.0 - 36.0 g/dL   RDW 16.1  09.6 - 04.5 %   Platelets 112 (*) 150 - 400 K/uL  BASIC METABOLIC PANEL     Status: Abnormal   Collection Time    03/07/13  1:12 AM      Result Value Range   Sodium 136  135 - 145 mEq/L   Potassium 3.3 (*) 3.5 - 5.1 mEq/L   Chloride 98  96 - 112 mEq/L   CO2 19  19 - 32 mEq/L   Glucose, Bld 95  70 - 99 mg/dL   BUN 5 (*) 6 - 23 mg/dL   Creatinine, Ser 4.09  0.50 - 1.35 mg/dL   Calcium 8.7  8.4 - 81.1 mg/dL   GFR calc non Af Amer >90  >90 mL/min   GFR calc Af Amer >90  >90 mL/min  POCT I-STAT TROPONIN I     Status: None   Collection Time    03/07/13  1:36 AM      Result Value Range   Troponin i, poc 0.00  0.00 - 0.08 ng/mL   Comment 3            BASIC METABOLIC PANEL     Status: Abnormal   Collection Time    03/07/13  3:48 AM      Result Value Range   Sodium 134 (*) 135 - 145 mEq/L   Potassium 3.1 (*) 3.5 - 5.1 mEq/L   Chloride 96  96 - 112 mEq/L   CO2 21  19 - 32 mEq/L   Glucose, Bld 79  70 - 99 mg/dL   BUN 5 (*) 6 - 23 mg/dL   Creatinine, Ser 9.14  0.50 - 1.35 mg/dL   Calcium 8.7  8.4 - 78.2 mg/dL   GFR calc non Af Amer >90  >90 mL/min   GFR calc Af Amer >90  >90 mL/min   Dg Chest 2 View  03/07/2013  *RADIOLOGY REPORT*  Clinical Data: Chest pain  CHEST - 2 VIEW  Comparison: 10/21/2012  Findings: Postoperative changes in the mediastinum are stable. The heart size and pulmonary vascularity are normal. The lungs appear clear and expanded without focal air space disease or consolidation. No blunting of the costophrenic angles.  No pneumothorax.  Mediastinal contours appear intact.  No significant change since the previous study.  IMPRESSION: No evidence of active pulmonary disease.   Original Report Authenticated By: Burman Nieves, M.D.    Dg Foot Complete Left  03/07/2013  *RADIOLOGY REPORT*  Clinical Data: Blunt trauma to the foot.  Pain, contusions, especially in the region of the toes.  LEFT FOOT - COMPLETE 3+ VIEW  Comparison: None  Findings: There are fractures of the second, third, and fourth metatarsals, associated with mild displacement.  There is associated soft tissue swelling along the dorsum of the foot.  No other fractures are identified.  IMPRESSION: Fractures of the second, third, fourth metatarsals.   Original Report Authenticated By: Norva Pavlov, M.D.     EKG: sinus tach, normal intervals and axis, questionable q waves in inferior leads, no acute ischemic changes.    MDM  High risk patient with signs and sx consistent with ACS.  First trop is negative and EKG notable only for sinus tach. We are treating with ASA, Plavix, NTG, MS, lovenox, BB.  Case discussed with Dr. Lovell Sheehan who will see and admit.  Holding orders written at her request.   Patient also noted to have subacute fx of the left 2nd, 3rd, 4th metatarsals. Will place in posterior splint and elevate. No signs of compartment syndrom. Anticipate ortho consult by IM.   Patient noted to be hypokalemic and this is being treated with po kcl. Also noted to be persistently tachycardic with history of 6 beer per day alcohol consumption as well as history of alcohol withdrawal. We will tx with Ativan.         Brandt Loosen, MD 03/07/13 [phone]
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
## 2013-03-07 NOTE — ED Notes (Signed)
Ortho Paged for splint application
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
## 2013-03-08 DIAGNOSIS — J209 Acute bronchitis, unspecified: Principal | ICD-10-CM

## 2013-03-08 DIAGNOSIS — R112 Nausea with vomiting, unspecified: Secondary | ICD-10-CM

## 2013-03-08 LAB — CBC
HCT: 42.3 % (ref 39.0–52.0)
Hemoglobin: 14.9 g/dL (ref 13.0–17.0)
MCV: 97.9 fL (ref 78.0–100.0)
WBC: 6.6 10*3/uL (ref 4.0–10.5)

## 2013-03-08 LAB — COMPREHENSIVE METABOLIC PANEL
Alkaline Phosphatase: 108 U/L (ref 39–117)
BUN: 8 mg/dL (ref 6–23)
Chloride: 99 mEq/L (ref 96–112)
Creatinine, Ser: 0.69 mg/dL (ref 0.50–1.35)
GFR calc Af Amer: >90 mL/min (ref >90)
GFR calc non Af Amer: >90 mL/min (ref >90)
Glucose, Bld: 125 mg/dL — ABNORMAL HIGH (ref 70–99)
Potassium: 4.3 mEq/L (ref 3.5–5.1)
Total Bilirubin: 0.5 mg/dL (ref 0.3–1.2)

## 2013-03-08 LAB — HEPATITIS PANEL, ACUTE
HCV Ab: NEGATIVE
Hepatitis B Surface Ag: NEGATIVE

## 2013-03-08 MED ORDER — ASPIRIN EC 81 MG PO TBEC
81.0000 mg | DELAYED_RELEASE_TABLET | Freq: Every day | ORAL | Status: DC
  Administered 2013-03-09 – 2013-03-10 (×2): 81 mg via ORAL
  Filled 2013-03-08 (×2): qty 1
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
## 2013-03-08 NOTE — Progress Notes (Signed)
Orthopedic Tech Progress Note Patient Details:  Ruben Sutton 23-May-1976 [id number] Patient was fitted for CAM [name]. Patient stated that he has had prior experience in CAM [name] and feels comfortable with application. Patient still has short leg splint on at this time and there was no direction to remove splint so it was left on. CAM [name] left on side table for patient use when appropriate per doctor/nurse clearing. Ortho Devices Type of Ortho Device: CAM [name] Ortho Device/Splint Interventions: Application   Asia R Thompson 03/08/2013, 12:16 PM

## 2013-03-08 NOTE — Plan of Care (Signed)
Problem: Phase II Progression Outcomes Goal: CV Risk Factors identified Outcome: Completed/Met Date Met:  03/08/13 Alcohol consumption Smoker High stress level Diet

## 2013-03-08 NOTE — Consult Note (Signed)
Reason for Consult: Acute L Foot px Referring Physician: Dr. Franky Macho is an 37 y.o. male.  HPI: We were consulted regarding Mr Reagle acute L foot injury, it appears he was working on his motorcycle 3 days ago when it incidentally fell over and landed on his LLE. He had immediate px in his L foot. He also began to have px in his chest that was quite substantial and reported to the ED for his chest px. With regards to his LLE his px is constrained to the L foot itself, it does no radiate up his leg, he denies any numbness or tingling, he cannot bear weight without substantial px.   Past Medical History  Diagnosis Date  . Hypertension     not taking meds    Past Surgical History  Procedure Laterality Date  . Coronary artery bypass graft      Family History  Problem Relation Age of Onset  . CAD Father   . Cancer      Social History:  reports that he has been smoking Cigarettes.  He has a 19 pack-year smoking history. He does not have any smokeless tobacco history on file. He reports that  drinks alcohol. He reports that he does not use illicit drugs.  Allergies: No Known Allergies  Medications: reviewed  Results for orders placed during the hospital encounter of 03/07/13 (from the past 48 hour(s))  CBC     Status: Abnormal   Collection Time    03/07/13  1:12 AM      Result Value Range   WBC 7.0  4.0 - 10.5 K/uL   RBC 4.40  4.22 - 5.81 MIL/uL   Hemoglobin 15.2  13.0 - 17.0 g/dL   HCT 47.8  29.5 - 62.1 %   MCV 97.5  78.0 - 100.0 fL   MCH 34.5 (*) 26.0 - 34.0 pg   MCHC 35.4  30.0 - 36.0 g/dL   RDW 30.8  65.7 - 84.6 %   Platelets 112 (*) 150 - 400 K/uL   Comment: CONSISTENT WITH PREVIOUS RESULT  BASIC METABOLIC PANEL     Status: Abnormal   Collection Time    03/07/13  1:12 AM      Result Value Range   Sodium 136  135 - 145 mEq/L   Potassium 3.3 (*) 3.5 - 5.1 mEq/L   Chloride 98  96 - 112 mEq/L   CO2 19  19 - 32 mEq/L   Glucose, Bld 95  70 - 99 mg/dL   BUN 5 (*) 6 - 23 mg/dL   Creatinine, Ser 9.62  0.50 - 1.35 mg/dL   Calcium 8.7  8.4 - 95.2 mg/dL   GFR calc non Af Amer >90  >90 mL/min   GFR calc Af Amer >90  >90 mL/min   Comment:            The eGFR has been calculated     using the CKD EPI equation.     This calculation has not been     validated in all clinical     situations.     eGFR's persistently     <90 mL/min signify     possible Chronic Kidney Disease.  POCT I-STAT TROPONIN I     Status: None   Collection Time    03/07/13  1:36 AM      Result Value Range   Troponin i, poc 0.00  0.00 - 0.08 ng/mL   Comment 3  Comment: Due to the release kinetics of cTnI,     a negative result within the first hours     of the onset of symptoms does not rule out     myocardial infarction with certainty.     If myocardial infarction is still suspected,     repeat the test at appropriate intervals.  BASIC METABOLIC PANEL     Status: Abnormal   Collection Time    03/07/13  3:48 AM      Result Value Range   Sodium 134 (*) 135 - 145 mEq/L   Potassium 3.1 (*) 3.5 - 5.1 mEq/L   Chloride 96  96 - 112 mEq/L   CO2 21  19 - 32 mEq/L   Glucose, Bld 79  70 - 99 mg/dL   BUN 5 (*) 6 - 23 mg/dL   Creatinine, Ser 7.82  0.50 - 1.35 mg/dL   Calcium 8.7  8.4 - 95.6 mg/dL   GFR calc non Af Amer >90  >90 mL/min   GFR calc Af Amer >90  >90 mL/min   Comment:            The eGFR has been calculated     using the CKD EPI equation.     This calculation has not been     validated in all clinical     situations.     eGFR's persistently     <90 mL/min signify     possible Chronic Kidney Disease.  TROPONIN I     Status: None   Collection Time    03/07/13  7:20 AM      Result Value Range   Troponin I <0.30  <0.30 ng/mL   Comment:            Due to the release kinetics of cTnI,     a negative result within the first hours     of the onset of symptoms does not rule out     myocardial infarction with certainty.     If myocardial infarction  is still suspected,     repeat the test at appropriate intervals.  ETHANOL     Status: Abnormal   Collection Time    03/07/13  7:30 AM      Result Value Range   Alcohol, Ethyl (B) 142 (*) 0 - 11 mg/dL   Comment:            LOWEST DETECTABLE LIMIT FOR     SERUM ALCOHOL IS 11 mg/dL     FOR MEDICAL PURPOSES ONLY  HEPATIC FUNCTION PANEL     Status: Abnormal   Collection Time    03/07/13  7:30 AM      Result Value Range   Total Protein 7.0  6.0 - 8.3 g/dL   Albumin 3.4 (*) 3.5 - 5.2 g/dL   AST 91 (*) 0 - 37 U/L   ALT 70 (*) 0 - 53 U/L   Alkaline Phosphatase 108  39 - 117 U/L   Total Bilirubin 0.4  0.3 - 1.2 mg/dL   Bilirubin, Direct <2.1  0.0 - 0.3 mg/dL   Indirect Bilirubin NOT CALCULATED  0.3 - 0.9 mg/dL  MAGNESIUM     Status: None   Collection Time    03/07/13  7:30 AM      Result Value Range   Magnesium 1.9  1.5 - 2.5 mg/dL  TROPONIN I     Status: None   Collection Time    03/07/13  1:03  PM      Result Value Range   Troponin I <0.30  <0.30 ng/mL   Comment:            Due to the release kinetics of cTnI,     a negative result within the first hours     of the onset of symptoms does not rule out     myocardial infarction with certainty.     If myocardial infarction is still suspected,     repeat the test at appropriate intervals.  TROPONIN I     Status: None   Collection Time    03/07/13  6:47 PM      Result Value Range   Troponin I <0.30  <0.30 ng/mL   Comment:            Due to the release kinetics of cTnI,     a negative result within the first hours     of the onset of symptoms does not rule out     myocardial infarction with certainty.     If myocardial infarction is still suspected,     repeat the test at appropriate intervals.  CBC     Status: Abnormal   Collection Time    03/08/13  4:36 AM      Result Value Range   WBC 6.6  4.0 - 10.5 K/uL   RBC 4.32  4.22 - 5.81 MIL/uL   Hemoglobin 14.9  13.0 - 17.0 g/dL   HCT 98.1  19.1 - 47.8 %   MCV 97.9  78.0 -  100.0 fL   MCH 34.5 (*) 26.0 - 34.0 pg   MCHC 35.2  30.0 - 36.0 g/dL   RDW 29.5  62.1 - 30.8 %   Platelets 117 (*) 150 - 400 K/uL   Comment: CONSISTENT WITH PREVIOUS RESULT  COMPREHENSIVE METABOLIC PANEL     Status: Abnormal   Collection Time    03/08/13  4:36 AM      Result Value Range   Sodium 134 (*) 135 - 145 mEq/L   Potassium 4.3  3.5 - 5.1 mEq/L   Comment: DELTA CHECK NOTED     NO VISIBLE HEMOLYSIS   Chloride 99  96 - 112 mEq/L   CO2 24  19 - 32 mEq/L   Glucose, Bld 125 (*) 70 - 99 mg/dL   BUN 8  6 - 23 mg/dL   Creatinine, Ser 6.57  0.50 - 1.35 mg/dL   Calcium 8.8  8.4 - 84.6 mg/dL   Total Protein 6.8  6.0 - 8.3 g/dL   Albumin 3.1 (*) 3.5 - 5.2 g/dL   AST 74 (*) 0 - 37 U/L   ALT 80 (*) 0 - 53 U/L   Alkaline Phosphatase 108  39 - 117 U/L   Total Bilirubin 0.5  0.3 - 1.2 mg/dL   GFR calc non Af Amer >90  >90 mL/min   GFR calc Af Amer >90  >90 mL/min   Comment:            The eGFR has been calculated     using the CKD EPI equation.     This calculation has not been     validated in all clinical     situations.     eGFR's persistently     <90 mL/min signify     possible Chronic Kidney Disease.    Dg Chest 2 View  03/07/2013  *RADIOLOGY REPORT*  Clinical Data: Chest pain  CHEST -  2 VIEW  Comparison: 10/21/2012  Findings: Postoperative changes in the mediastinum are stable. The heart size and pulmonary vascularity are normal. The lungs appear clear and expanded without focal air space disease or consolidation. No blunting of the costophrenic angles.  No pneumothorax.  Mediastinal contours appear intact.  No significant change since the previous study.  IMPRESSION: No evidence of active pulmonary disease.   Original Report Authenticated By: Burman Nieves, M.D.    Ct Angio Chest Pe W/cm &/or Wo Cm  03/07/2013  *RADIOLOGY REPORT*  Clinical Data: Chest pain  CT ANGIOGRAPHY CHEST  Technique:  Multidetector CT imaging of the chest using the standard protocol during bolus  administration of intravenous contrast. Multiplanar reconstructed images including MIPs were obtained and reviewed to evaluate the vascular anatomy.  Contrast: OMNIPAQUE IOHEXOL 350 MG/ML SOLN  Comparison: 04/25/2010  Findings: There is suboptimal opacification of the pulmonary arteries after two injections markedly limiting this exam.  No obvious filling defects are present in the pulmonary arterial tree to suggest acute pulmonary thromboembolism.  No abnormal mediastinal adenopathy or pericardial effusion.  No pneumothorax and no pleural effusion.  Irregular patchy parenchymal opacity are present at the dependent portion of both lung bases left greater than right.  Subsegmental atelectasis is also present. Reticular markings in the anterior right upper and left upper lobes have a chronic appearance.  There are chronic fractures of the spinous processes of T4, T5, and T6.  No acute bony deformity.  Diffuse hepatic steatosis.  IMPRESSION: No evidence of acute pulmonary thromboembolism.  Patchy bibasilar atelectasis verses airspace disease left greater than right as described.  Diffuse hepatic steatosis.   Original Report Authenticated By: Jolaine Click, M.D.    Dg Foot Complete Left  03/07/2013  *RADIOLOGY REPORT*  Clinical Data: Blunt trauma to the foot.  Pain, contusions, especially in the region of the toes.  LEFT FOOT - COMPLETE 3+ VIEW  Comparison: None  Findings: There are fractures of the second, third, and fourth metatarsals, associated with mild displacement.  There is associated soft tissue swelling along the dorsum of the foot.  No other fractures are identified.  IMPRESSION: Fractures of the second, third, fourth metatarsals.   Original Report Authenticated By: Norva Pavlov, M.D.     ROS Blood pressure 144/90, pulse 93, temperature 97.6 F (36.4 C), temperature source Oral, resp. rate 18, height 5\' 9"  (1.753 m), weight 107.956 kg (238 lb), SpO2 94.00%. Physical Exam Pt is lying  comfortably in hospital bed, splint is in place on LLE, full ROM @ knee, TTP over L foot/metatarsals, 2+ DPP, cap refill <2sec BIL in LE, neurovascularly intact.  Assessment/Plan: -Acute 4 day hx of minimally displaced fx's of 2nd/3rd/4th metatarsals  -Short CAM boot ordered yesterday, still not in room, pt is to wear CAM boot at all times, he  can bear weight in CAM boot, NWB in splint until CAM boot arrives  -OT for gait training and assistance with modifying ADL's in CAM boot  -F/U at office upon d/c from hospital -Chronic changes and chronic R ankle px, pt was to f/u with our partner Dr Luiz Blare regarding his R ankle, non-acute, no additional interventions needed at this time  Georga Bora 03/08/2013, 10:20 AM

## 2013-03-09 DIAGNOSIS — R7401 Elevation of levels of liver transaminase levels: Secondary | ICD-10-CM

## 2013-03-09 DIAGNOSIS — F10239 Alcohol dependence with withdrawal, unspecified: Secondary | ICD-10-CM

## 2013-03-09 DIAGNOSIS — F10939 Alcohol use, unspecified with withdrawal, unspecified: Secondary | ICD-10-CM

## 2013-03-09 LAB — BASIC METABOLIC PANEL
BUN: 12 mg/dL (ref 6–23)
CO2: 25 mEq/L (ref 19–32)
Calcium: 8.9 mg/dL (ref 8.4–10.5)
Creatinine, Ser: 0.78 mg/dL (ref 0.50–1.35)
Glucose, Bld: 136 mg/dL — ABNORMAL HIGH (ref 70–99)
Sodium: 135 mEq/L (ref 135–145)

## 2013-03-09 NOTE — Consult Note (Signed)
Agree with note above. 
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
## 2013-03-09 NOTE — Progress Notes (Signed)
OT NOTE  Pt was unable to don Cam boot over Lt foot splint. Pt reports increased pain attempting. 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 Pt must be independent with all adls to provide care to grandmother.    Harrel Carina [place]   OTR/L Pager: [phone] Office: [phone] .

## 2013-03-09 NOTE — Evaluation (Signed)
Occupational Therapy Evaluation Patient Details Name: Ruben Sutton MRN: [id number] DOB: [birth year] Today's Date: 03/09/2013 Time: 0102-7253 OT Time Calculation (min): 11 min  OT Assessment / Plan / Recommendation Clinical Impression  37 yo male with BIL LE cam boot due to revealed fractures of the left 2nd, ,3rd, and 4th MTPs. Pt unable to don cam boot over splint on LT LE during evaluation. OT to follow acutely. NO further OT recommended    OT Assessment  Patient does not need any further OT services    Follow Up Recommendations  No OT follow up    Barriers to Discharge      Equipment Recommendations  None recommended by OT    Recommendations for Other Services    Frequency       Precautions / Restrictions Precautions Precautions: Fall Precaution Comments: BIL CAM Boot Restrictions Weight Bearing Restrictions: No   Pertinent Vitals/Pain Limited by 10 out 10 pain BP 188/ 118 with RN Chasidy providing medication Pt declining OOB or to fully don Lt Cam boot due to pain    ADL  Eating/Feeding: Independent Where Assessed - Eating/Feeding: Bed level Grooming: Wash/dry face;Independent Where Assessed - Grooming: Supported sitting Lower Body Dressing: Set up Where Assessed - Lower Body Dressing: Unsupported sitting (LONG SITTING) ADL Comments: Pt completes LOng sitting in bed to attempt to don BIL cam boot. Pt with cast on LT LE and attempting to don cam boot over top. Pt unabel to do so. Pt declining OOB with OT and states "I am not trying to put this boot on because you want me to and it hurts. " pt very limited participation.     OT Diagnosis:    OT Problem List:   OT Treatment Interventions:     OT Goals Acute Rehab OT Goals OT Goal Formulation: With patient Time For Goal Achievement: 03/23/13 Potential to Achieve Goals: Good ADL Goals Pt Will Perform Lower Body Dressing: Independently;Sit to stand from chair ADL Goal: Lower Body Dressing - Progress: Goal set  today Pt Will Transfer to Toilet: Independently;Ambulation;Regular height toilet ADL Goal: Toilet Transfer - Progress: Goal set today Miscellaneous OT Goals Miscellaneous OT Goal #1: Pt will don bil cam boot independently OT Goal: Miscellaneous Goal #1 - Progress: Goal set today  Visit Information  Last OT Received On: 03/09/13 Assistance Needed: +1    Subjective Data  Subjective: "I can figure it out and do it myself" Patient Stated Goal: to go home and take care of grandmother   Prior Functioning     Home Living Lives With: Other (Comment) (grandmother with dementia) Available Help at Discharge: Family Type of Home: House Prior Function Level of Independence: Independent Driving: Yes Vocation: On disability Communication Communication: No difficulties Dominant Hand: Right         Vision/Perception Vision - History Baseline Vision: No visual deficits Patient Visual Report: No change from baseline   Cognition  Cognition Overall Cognitive Status: Appears within functional limits for tasks assessed/performed Arousal/Alertness: Awake/alert Orientation Level: Appears intact for tasks assessed Behavior During Session: [facility] for tasks performed Cognition - Other Comments: Pt demonstrates poor problem solving by stating that he doesnt need to put on cam boot . 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  Extremity/Trunk Assessment Right Upper Extremity Assessment RUE ROM/Strength/Tone: Within functional levels RUE Sensation: WFL - Light Touch RUE Coordination: WFL - gross/fine motor Left Upper Extremity Assessment LUE ROM/Strength/Tone: Within functional levels LUE Sensation: WFL - Light Touch LUE Coordination: WFL - gross/fine motor Trunk Assessment Trunk  Assessment: Kyphotic     Mobility Bed Mobility Bed Mobility: Supine to Sit Supine to Sit: 7: Independent Details for Bed Mobility Assistance: Pt declined EOB sitting Transfers Transfers: Not assessed     Exercise     Balance     End of Session OT - End of Session Activity Tolerance: Patient limited by pain (Pain in Lt LE) Patient left: in bed;with call bell/phone within reach Nurse Communication: Precautions  GO     Lucile Shutters 03/09/2013, 2:12 PM Pager: [phone]

## 2013-03-10 LAB — COMPREHENSIVE METABOLIC PANEL
AST: 40 U/L — ABNORMAL HIGH (ref 0–37)
CO2: 25 mEq/L (ref 19–32)
Calcium: 8.4 mg/dL (ref 8.4–10.5)
Creatinine, Ser: 0.73 mg/dL (ref 0.50–1.35)
GFR calc Af Amer: >90 mL/min (ref >90)
GFR calc non Af Amer: >90 mL/min (ref >90)
Total Protein: 6.4 g/dL (ref 6.0–8.3)

## 2013-03-10 LAB — CBC
MCH: 33.8 pg (ref 26.0–34.0)
MCHC: 34.8 g/dL (ref 30.0–36.0)
MCV: 97.3 fL (ref 78.0–100.0)
Platelets: 116 10*3/uL — ABNORMAL LOW (ref 150–400)
RBC: 4.11 MIL/uL — ABNORMAL LOW (ref 4.22–5.81)

## 2013-03-10 MED ORDER — PREDNISONE 10 MG PO TABS: ORAL_TABLET | ORAL | Status: DC

## 2013-03-10 MED ORDER — OMEPRAZOLE 20 MG PO CPDR: 20.0000 mg | DELAYED_RELEASE_CAPSULE | Freq: Every day | ORAL | Status: DC

## 2013-03-10 MED ORDER — OXYCODONE HCL 5 MG PO TABS: 5.0000 mg | ORAL_TABLET | ORAL | Status: DC | PRN

## 2013-03-10 MED ORDER — ADULT MULTIVITAMIN W/MINERALS CH: 1.0000 | ORAL_TABLET | Freq: Every day | ORAL | Status: DC

## 2013-03-10 MED ORDER — LORAZEPAM 1 MG PO TABS: 1.0000 mg | ORAL_TABLET | Freq: Three times a day (TID) | ORAL | Status: DC | PRN

## 2013-03-10 MED ORDER — ASPIRIN 81 MG PO TBEC: 81.0000 mg | DELAYED_RELEASE_TABLET | Freq: Every day | ORAL | Status: DC

## 2013-03-10 MED ORDER — LEVOFLOXACIN 500 MG PO TABS: 500.0000 mg | ORAL_TABLET | Freq: Every day | ORAL | Status: DC

## 2013-03-10 MED ORDER — ALBUTEROL SULFATE HFA 108 (90 BASE) MCG/ACT IN AERS: 2.0000 | INHALATION_SPRAY | RESPIRATORY_TRACT | Status: DC | PRN

## 2013-03-10 NOTE — Evaluation (Signed)
Physical Therapy Evaluation Patient Details Name: Ruben Sutton MRN: [id number] DOB: 07-14-1976 Today's Date: 03/10/2013 Time: 4098-1191 PT Time Calculation (min): 12 min  PT Assessment / Plan / Recommendation Clinical Impression  37 yo adm with CP and Lt foot fracture. Ruled out CAD or PE as source of chest pain. Pt instructed in safe use of cane vs crutches and pt insisted on using cane. Pt given a cane that was left in hospital and remained unclaimed.    PT Assessment  Patent does not need any further PT services    Follow Up Recommendations  No PT follow up    Does the patient have the potential to tolerate intense rehabilitation      Barriers to Discharge        Equipment Recommendations  Gilmer Mor (provided)    Recommendations for Other Services     Frequency      Precautions / Restrictions Precautions Precautions: Fall Required Braces or Orthoses: Other Brace/Splint Other Brace/Splint: bil cam boots Restrictions Weight Bearing Restrictions: Yes LLE Weight Bearing: Weight bearing as tolerated Other Position/Activity Restrictions: per Dorathy Daft, PA for ortho WBAT LLE in cam boot   Pertinent Vitals/Pain Reports pain in Lt foot worse than Rt ankle (not rated)      Mobility  Bed Mobility Bed Mobility: Supine to Sit;Sitting - Scoot to Edge of Bed;Sit to Supine Supine to Sit: 7: Independent Sitting - Scoot to Edge of Bed: 7: Independent Sit to Supine: 7: Independent Transfers Transfers: Sit to Stand;Stand to Sit Sit to Stand: 6: Modified independent (Device/Increase time) Stand to Sit: 6: Modified independent (Device/Increase time) Details for Transfer Assistance: used straight cane in RUE Ambulation/Gait Ambulation/Gait Assistance: 6: Modified independent (Device/Increase time) Ambulation Distance (Feet): 40 Feet Assistive device: Straight cane;Other (Comment) (bil cam boots) Ambulation/Gait Assistance Details: pt educated to use cane opposite the most painful leg/foot  (currently his Lt foot); discussed benefits of using a crutch over a cane and pt reports he has crutches at home and prefers to use the cane Gait Pattern: Step-to pattern;Decreased stride length;Antalgic Stairs: No    Exercises     PT Diagnosis:    PT Problem List:   PT Treatment Interventions:     PT Goals    Visit Information  Last PT Received On: 03/10/13 Assistance Needed: +1    Subjective Data  Subjective: Reports he's very familiar with cam boot (worn previously and also for last week on RLE) Patient Stated Goal: go home   Prior Functioning  Home Living Lives With: Family Available Help at Discharge: Family Type of Home: House Home Access: Stairs to enter Secretary/administrator of Steps: 3 Entrance Stairs-Rails: Right Home Layout: One level Home Adaptive Equipment: Crutches Additional Comments: stays with his grandmother who has dementia Prior Function Level of Independence: Independent Able to Take Stairs?: Yes Communication Communication: No difficulties    Cognition  Cognition Overall Cognitive Status: Impaired Area of Impairment: Safety/judgement Arousal/Alertness: Awake/alert Behavior During Session: WFL for tasks performed Safety/Judgement: Decreased awareness of safety precautions;Decreased safety judgement for tasks assessed Safety/Judgement - Other Comments: up walking in room without cam boots (+posterior splint on Left foot); pt acknowledged he knew he was supposed to wear them    Extremity/Trunk Assessment Right Lower Extremity Assessment RLE ROM/Strength/Tone: Ruben Sutton for tasks assessed Left Lower Extremity Assessment LLE ROM/Strength/Tone: Ruben Sutton for tasks assessed Trunk Assessment Trunk Assessment: Normal   Balance    End of Session PT - End of Session Equipment Utilized During Treatment:  (bil cam boots)  Activity Tolerance: Patient tolerated treatment well Patient left: in bed;with call bell/phone within reach Nurse Communication: Mobility  status;Other (comment) (cane provided; no further DME or HH needs)  GP     [name] 03/10/2013, 10:02 AM Pager [phone]

## 2013-03-10 NOTE — Progress Notes (Signed)
Pt provided with dc instructions and education. Pt verbalized understanding. Pt aware of all medications and how to take them. Dr. Rito Ehrlich made aware of patient HR increasing to 140s (ST) with ambulation. Pt asymptomatic. VSS. No questions at this time. IV removed with tip intact. Heart monitor cleaned and returned to front. Levonne Spiller, RN
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
## 2013-03-23 ENCOUNTER — Inpatient Hospital Stay (HOSPITAL_COMMUNITY)
Admission: EM | Admit: 2013-03-23 | Discharge: 2013-03-25 | DRG: 439 | Disposition: A | Discharge status: Home / Self Care | Payer: Medicaid Other | Attending: Family Medicine | Admitting: Family Medicine

## 2013-03-23 ENCOUNTER — Emergency Department (HOSPITAL_COMMUNITY): Payer: Medicaid Other

## 2013-03-23 ENCOUNTER — Encounter (HOSPITAL_COMMUNITY): Payer: Self-pay | Admitting: Family Medicine

## 2013-03-23 DIAGNOSIS — K859 Acute pancreatitis without necrosis or infection, unspecified: Principal | ICD-10-CM | POA: Diagnosis present

## 2013-03-23 DIAGNOSIS — R079 Chest pain, unspecified: Secondary | ICD-10-CM

## 2013-03-23 DIAGNOSIS — E876 Hypokalemia: Secondary | ICD-10-CM

## 2013-03-23 DIAGNOSIS — R7402 Elevation of levels of lactic acid dehydrogenase (LDH): Secondary | ICD-10-CM | POA: Diagnosis present

## 2013-03-23 DIAGNOSIS — Z951 Presence of aortocoronary bypass graft: Secondary | ICD-10-CM

## 2013-03-23 DIAGNOSIS — D696 Thrombocytopenia, unspecified: Secondary | ICD-10-CM | POA: Diagnosis present

## 2013-03-23 DIAGNOSIS — F10939 Alcohol use, unspecified with withdrawal, unspecified: Secondary | ICD-10-CM | POA: Diagnosis present

## 2013-03-23 DIAGNOSIS — E860 Dehydration: Secondary | ICD-10-CM

## 2013-03-23 DIAGNOSIS — R112 Nausea with vomiting, unspecified: Secondary | ICD-10-CM | POA: Diagnosis present

## 2013-03-23 DIAGNOSIS — F17200 Nicotine dependence, unspecified, uncomplicated: Secondary | ICD-10-CM | POA: Diagnosis present

## 2013-03-23 DIAGNOSIS — F1010 Alcohol abuse, uncomplicated: Secondary | ICD-10-CM | POA: Diagnosis present

## 2013-03-23 DIAGNOSIS — J209 Acute bronchitis, unspecified: Secondary | ICD-10-CM

## 2013-03-23 DIAGNOSIS — I10 Essential (primary) hypertension: Secondary | ICD-10-CM | POA: Diagnosis present

## 2013-03-23 DIAGNOSIS — Z79899 Other long term (current) drug therapy: Secondary | ICD-10-CM

## 2013-03-23 DIAGNOSIS — I209 Angina pectoris, unspecified: Secondary | ICD-10-CM

## 2013-03-23 DIAGNOSIS — R7401 Elevation of levels of liver transaminase levels: Secondary | ICD-10-CM | POA: Diagnosis present

## 2013-03-23 DIAGNOSIS — R1013 Epigastric pain: Secondary | ICD-10-CM | POA: Diagnosis present

## 2013-03-23 DIAGNOSIS — Z720 Tobacco use: Secondary | ICD-10-CM

## 2013-03-23 DIAGNOSIS — K852 Alcohol induced acute pancreatitis without necrosis or infection: Secondary | ICD-10-CM

## 2013-03-23 DIAGNOSIS — J441 Chronic obstructive pulmonary disease with (acute) exacerbation: Secondary | ICD-10-CM | POA: Diagnosis present

## 2013-03-23 DIAGNOSIS — F10929 Alcohol use, unspecified with intoxication, unspecified: Secondary | ICD-10-CM

## 2013-03-23 DIAGNOSIS — F10239 Alcohol dependence with withdrawal, unspecified: Secondary | ICD-10-CM | POA: Diagnosis present

## 2013-03-23 HISTORY — DX: Acute pancreatitis without necrosis or infection, unspecified: K85.90

## 2013-03-23 HISTORY — DX: Angina pectoris, unspecified: I20.9

## 2013-03-23 HISTORY — DX: Reserved for inherently not codable concepts without codable children: IMO0001

## 2013-03-23 HISTORY — DX: Alcohol dependence, uncomplicated: F10.20

## 2013-03-23 HISTORY — DX: Chronic obstructive pulmonary disease, unspecified: J44.9

## 2013-03-23 HISTORY — DX: Shortness of breath: R06.02

## 2013-03-23 HISTORY — DX: Unspecified fracture of unspecified foot, initial encounter for closed fracture: S92.909A

## 2013-03-23 LAB — COMPREHENSIVE METABOLIC PANEL
ALT: 60 U/L — ABNORMAL HIGH (ref 0–53)
Alkaline Phosphatase: 149 U/L — ABNORMAL HIGH (ref 39–117)
CO2: 23 mEq/L (ref 19–32)
GFR calc Af Amer: >90 mL/min (ref >90)
GFR calc non Af Amer: >90 mL/min (ref >90)
Glucose, Bld: 133 mg/dL — ABNORMAL HIGH (ref 70–99)
Potassium: 2.8 mEq/L — ABNORMAL LOW (ref 3.5–5.1)
Sodium: 134 mEq/L — ABNORMAL LOW (ref 135–145)
Total Protein: 7.3 g/dL (ref 6.0–8.3)

## 2013-03-23 LAB — BASIC METABOLIC PANEL
BUN: 3 mg/dL — ABNORMAL LOW (ref 6–23)
Calcium: 7.5 mg/dL — ABNORMAL LOW (ref 8.4–10.5)
Creatinine, Ser: 0.71 mg/dL (ref 0.50–1.35)
GFR calc non Af Amer: >90 mL/min (ref >90)
Glucose, Bld: 161 mg/dL — ABNORMAL HIGH (ref 70–99)
Sodium: 136 mEq/L (ref 135–145)

## 2013-03-23 LAB — LIPASE, BLOOD: Lipase: 42 U/L (ref 11–59)

## 2013-03-23 LAB — CBC
Hemoglobin: 15.8 g/dL (ref 13.0–17.0)
RBC: 4.55 MIL/uL (ref 4.22–5.81)
WBC: 3.6 10*3/uL — ABNORMAL LOW (ref 4.0–10.5)

## 2013-03-23 LAB — TROPONIN I: Troponin I: <0.3 ng/mL (ref <0.30)

## 2013-03-23 LAB — POCT I-STAT TROPONIN I: Troponin i, poc: 0 ng/mL (ref 0.00–0.08)

## 2013-03-23 LAB — APTT: aPTT: 28 seconds (ref 24–37)

## 2013-03-23 MED ORDER — CLOPIDOGREL BISULFATE 300 MG PO TABS
300.0000 mg | ORAL_TABLET | Freq: Once | ORAL | Status: AC
  Administered 2013-03-23: 300 mg via ORAL
  Filled 2013-03-23: qty 1

## 2013-03-23 MED ORDER — THIAMINE HCL 100 MG/ML IJ SOLN
100.0000 mg | Freq: Once | INTRAMUSCULAR | Status: AC
  Administered 2013-03-23: 100 mg via INTRAVENOUS
  Filled 2013-03-23: qty 2

## 2013-03-23 MED ORDER — MAGNESIUM SULFATE 40 MG/ML IJ SOLN
2.0000 g | Freq: Once | INTRAMUSCULAR | Status: AC
  Administered 2013-03-23: 2 g via INTRAVENOUS
  Filled 2013-03-23: qty 50

## 2013-03-23 MED ORDER — LORAZEPAM 2 MG/ML IJ SOLN
1.0000 mg | Freq: Four times a day (QID) | INTRAMUSCULAR | Status: DC | PRN
  Administered 2013-03-23 – 2013-03-24 (×3): 1 mg via INTRAVENOUS
  Filled 2013-03-23 (×4): qty 1

## 2013-03-23 MED ORDER — ASPIRIN EC 81 MG PO TBEC
81.0000 mg | DELAYED_RELEASE_TABLET | Freq: Every day | ORAL | Status: DC
  Administered 2013-03-23 – 2013-03-24 (×2): 81 mg via ORAL
  Filled 2013-03-23 (×3): qty 1

## 2013-03-23 MED ORDER — PREDNISONE 20 MG PO TABS
60.0000 mg | ORAL_TABLET | Freq: Once | ORAL | Status: AC
  Administered 2013-03-23: 60 mg via ORAL
  Filled 2013-03-23: qty 3

## 2013-03-23 MED ORDER — SODIUM CHLORIDE 0.9 % IV BOLUS (SEPSIS)
1000.0000 mL | Freq: Once | INTRAVENOUS | Status: AC
  Administered 2013-03-23: 1000 mL via INTRAVENOUS

## 2013-03-23 MED ORDER — LORAZEPAM 2 MG/ML IJ SOLN
1.0000 mg | INTRAMUSCULAR | Status: DC | PRN
  Administered 2013-03-23: 1 mg via INTRAVENOUS
  Filled 2013-03-23: qty 1

## 2013-03-23 MED ORDER — ENOXAPARIN SODIUM 40 MG/0.4ML ~~LOC~~ SOLN
40.0000 mg | SUBCUTANEOUS | Status: DC
  Administered 2013-03-23: 40 mg via SUBCUTANEOUS
  Filled 2013-03-23 (×5): qty 0.4

## 2013-03-23 MED ORDER — POTASSIUM CHLORIDE 10 MEQ/100ML IV SOLN
10.0000 meq | Freq: Once | INTRAVENOUS | Status: AC
  Administered 2013-03-23: 10 meq via INTRAVENOUS
  Filled 2013-03-23: qty 100

## 2013-03-23 MED ORDER — LEVOFLOXACIN IN D5W 500 MG/100ML IV SOLN
500.0000 mg | INTRAVENOUS | Status: DC
  Administered 2013-03-23 – 2013-03-24 (×2): 500 mg via INTRAVENOUS
  Filled 2013-03-23 (×4): qty 100

## 2013-03-23 MED ORDER — MORPHINE SULFATE 2 MG/ML IJ SOLN
1.0000 mg | INTRAMUSCULAR | Status: DC | PRN
  Administered 2013-03-23 – 2013-03-24 (×5): 1 mg via INTRAVENOUS
  Filled 2013-03-23 (×5): qty 1

## 2013-03-23 MED ORDER — THIAMINE HCL 100 MG/ML IJ SOLN
Freq: Once | INTRAVENOUS | Status: AC
  Administered 2013-03-23: 12:00:00 via INTRAVENOUS
  Filled 2013-03-23: qty 1000

## 2013-03-23 MED ORDER — SODIUM CHLORIDE 0.9 % IJ SOLN
3.0000 mL | Freq: Two times a day (BID) | INTRAMUSCULAR | Status: DC
  Administered 2013-03-24: 3 mL via INTRAVENOUS

## 2013-03-23 MED ORDER — ONDANSETRON HCL 4 MG/2ML IJ SOLN
4.0000 mg | Freq: Once | INTRAMUSCULAR | Status: AC
  Administered 2013-03-23: 4 mg via INTRAVENOUS
  Filled 2013-03-23: qty 2

## 2013-03-23 MED ORDER — LORAZEPAM 2 MG/ML IJ SOLN
1.0000 mg | Freq: Once | INTRAMUSCULAR | Status: AC
  Administered 2013-03-23: 1 mg via INTRAVENOUS
  Filled 2013-03-23: qty 1

## 2013-03-23 MED ORDER — NITROGLYCERIN 2 % TD OINT
1.0000 [in_us] | TOPICAL_OINTMENT | Freq: Once | TRANSDERMAL | Status: AC
  Administered 2013-03-23: 1 [in_us] via TOPICAL
  Filled 2013-03-23: qty 1

## 2013-03-23 MED ORDER — POTASSIUM CHLORIDE CRYS ER 20 MEQ PO TBCR
40.0000 meq | EXTENDED_RELEASE_TABLET | Freq: Once | ORAL | Status: AC
  Administered 2013-03-23: 40 meq via ORAL
  Filled 2013-03-23: qty 2

## 2013-03-23 MED ORDER — OXYCODONE-ACETAMINOPHEN 5-325 MG PO TABS
1.0000 | ORAL_TABLET | Freq: Four times a day (QID) | ORAL | Status: DC | PRN
  Administered 2013-03-23 (×2): 2 via ORAL
  Administered 2013-03-24: 1 via ORAL
  Administered 2013-03-24 – 2013-03-25 (×4): 2 via ORAL
  Filled 2013-03-23 (×7): qty 2

## 2013-03-23 MED ORDER — ASPIRIN 81 MG PO CHEW
324.0000 mg | CHEWABLE_TABLET | Freq: Once | ORAL | Status: AC
  Administered 2013-03-23: 324 mg via ORAL
  Filled 2013-03-23: qty 4

## 2013-03-23 MED ORDER — ALBUTEROL (5 MG/ML) CONTINUOUS INHALATION SOLN
10.0000 mg/h | INHALATION_SOLUTION | Freq: Once | RESPIRATORY_TRACT | Status: AC
  Administered 2013-03-23: 10 mg/h via RESPIRATORY_TRACT
  Filled 2013-03-23: qty 20

## 2013-03-23 MED ORDER — POTASSIUM CHLORIDE CRYS ER 20 MEQ PO TBCR
30.0000 meq | EXTENDED_RELEASE_TABLET | Freq: Two times a day (BID) | ORAL | Status: AC
  Administered 2013-03-23 (×2): 30 meq via ORAL
  Filled 2013-03-23 (×2): qty 1
  Filled 2013-03-23: qty 2

## 2013-03-23 MED ORDER — MORPHINE SULFATE 4 MG/ML IJ SOLN
4.0000 mg | INTRAMUSCULAR | Status: AC
  Administered 2013-03-23: 4 mg via INTRAVENOUS
  Filled 2013-03-23: qty 1

## 2013-03-23 MED ORDER — LORAZEPAM 2 MG/ML IJ SOLN
1.0000 mg | Freq: Four times a day (QID) | INTRAMUSCULAR | Status: DC
Start: 2013-03-23 — End: 2013-03-23
  Filled 2013-03-23: qty 1

## 2013-03-23 MED ORDER — IPRATROPIUM BROMIDE 0.02 % IN SOLN
0.5000 mg | RESPIRATORY_TRACT | Status: DC
  Administered 2013-03-23 – 2013-03-24 (×9): 0.5 mg via RESPIRATORY_TRACT
  Filled 2013-03-23 (×9): qty 2.5

## 2013-03-23 MED ORDER — PREDNISONE 20 MG PO TABS
40.0000 mg | ORAL_TABLET | Freq: Every day | ORAL | Status: DC
Start: 2013-03-24 — End: 2013-03-24
  Administered 2013-03-24: 40 mg via ORAL
  Filled 2013-03-23 (×3): qty 2

## 2013-03-23 MED ORDER — ALBUTEROL SULFATE (5 MG/ML) 0.5% IN NEBU
2.5000 mg | INHALATION_SOLUTION | RESPIRATORY_TRACT | Status: DC
  Administered 2013-03-23 – 2013-03-24 (×9): 2.5 mg via RESPIRATORY_TRACT
  Filled 2013-03-23 (×10): qty 0.5

## 2013-03-23 MED ORDER — ONDANSETRON 4 MG PO TBDP
4.0000 mg | ORAL_TABLET | Freq: Three times a day (TID) | ORAL | Status: DC | PRN
  Administered 2013-03-23: 4 mg via ORAL
  Filled 2013-03-23 (×2): qty 1

## 2013-03-23 MED ORDER — LORAZEPAM 2 MG/ML IJ SOLN
1.0000 mg | Freq: Four times a day (QID) | INTRAMUSCULAR | Status: DC
  Administered 2013-03-23 – 2013-03-24 (×3): 1 mg via INTRAVENOUS
  Filled 2013-03-23 (×2): qty 1

## 2013-03-23 MED ORDER — LISINOPRIL 10 MG PO TABS
10.0000 mg | ORAL_TABLET | Freq: Every day | ORAL | Status: DC
  Administered 2013-03-23 – 2013-03-24 (×2): 10 mg via ORAL
  Filled 2013-03-23 (×3): qty 1

## 2013-03-23 MED ORDER — HYDRALAZINE HCL 20 MG/ML IJ SOLN: 5.0000 mg | INTRAMUSCULAR | Status: DC | PRN

## 2013-03-23 NOTE — ED Notes (Signed)
Respiratory notified of continuous neb tx

## 2013-03-23 NOTE — Progress Notes (Signed)
Utilization review completed.  
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
## 2013-03-23 NOTE — ED Notes (Signed)
Pt had emesis, vomited up PO potassium and aspirin.

## 2013-03-23 NOTE — ED Provider Notes (Signed)
History     CSN: [id number]  Arrival date & time 03/23/13  0331   First MD Initiated Contact with Patient 03/23/13 [phone]      Chief Complaint  Patient presents with  . Chest Pain    (Consider location/radiation/quality/duration/timing/severity/associated sxs/prior treatment) HPI  Patient is a 37 yo man with a history of CAD - s/p CABG in 2002, alcoholism, HTN, COPD,  recent comminuted fx of several left metatarsals secondary to crush injury, tobacco abuse.   He presents to the ED tonight with complaints of chest pain x 2-3 days. Pain is aching, constant, 9/10, nonradiating. Pt denies worsening or improving factors. Patient says he has had multiple previous episodes of similar chest pain. I asked when he most recent cardiac cath was and he said "last week" but, chart review does not reveal any recent functional cardiac studies or notes from cardiology.   The patient is not followed by a cardiologist nor is he taking any medications.   Patient also complains of left foot pain. He has not been wearing the [employer] boots with which he was discharged. He has been mostly sedentary and wears flip flops when he gets up to use the bathroom.   Patient also complains of wheezing and SOB for several hours. He was treated with Albuterol 10mg  and Atrovent 0.5mg  en route by EMS. Patient feels "a little better" with regards to respiratory issues.   Mr. Frayre also complains of alcohol withdrawal symptoms. He typically drinks a fifth/day of whiskey but has had only 1 beer in the past 24 hrs. That was at around 11am yesterday. He says he feels jittery and irritable. He denies history of alcoholic withdrawal seizures or hallucinations.   Past Medical History  Diagnosis Date  . Hypertension     not taking meds  . Broken foot 2014    bilateral feet  . Alcoholism /alcohol abuse     Past Surgical History  Procedure Laterality Date  . Coronary artery bypass graft      Family History  Problem  Relation Age of Onset  . CAD Father   . Cancer      History  Substance Use Topics  . Smoking status: Current Every Day Smoker -- 1.00 packs/day for 19 years    Types: Cigarettes  . Smokeless tobacco: Not on file  . Alcohol Use: 0.0 oz/week     Comment: pt states he drinks 1 to 1.5 fifths of liquor per day      Review of Systems  Gen: no weight loss, fevers, chills, night sweats Eyes: no discharge or drainage, no occular pain or visual changes Nose: no epistaxis or rhinorrhea Mouth: no dental pain, no sore throat Neck: no neck pain Lungs: As per history of present illness, otherwise negative CV: As per history of present illness, otherwise negative Abd: no abdominal pain, nausea, vomiting GU: no dysuria or gross hematuria MSK: As per history of present illness, otherwise negative Neuro: no headache, no focal neurologic deficits Skin: no rash Psyche: As per history of present illness, otherwise negative  Allergies  Review of patient's allergies indicates no known allergies.  Home Medications   Current Outpatient Rx  Name  Route  Sig  Dispense  Refill  . ibuprofen (ADVIL,MOTRIN) 200 MG tablet   Oral   Take 400 mg by mouth every 6 (six) hours as needed for pain.           BP 152/101  Pulse 114  Temp(Src) 97.4 F (36.3 C)  Resp 26  SpO2 100%  Physical Exam Gen: well developed and well nourished appearing, noted to be tachypneic Head: NCAT Eyes: PERL, EOMI Nose: no epistaixis or rhinorrhea Mouth/throat: mucosa is moist and pink Neck: supple, no stridor, no jvd Lungs: RR 28/min with diffuse, bilateral wheezing Abd: soft, notender, nondistended, obese Back: no ttp, no cva ttp Skin: no rashese, wnl Neuro: CN ii-xii grossly intact, no focal deficits MSK: ttp over the left foot Psyche; mildly anxious affect,  calm and cooperative.   ED Course  Procedures (including critical care time)   Results for orders placed during the hospital encounter of 03/23/13  (from the past 24 hour(s))  CBC     Status: Abnormal   Collection Time    03/23/13  4:14 AM      Result Value Range   WBC 3.6 (*) 4.0 - 10.5 K/uL   RBC 4.55  4.22 - 5.81 MIL/uL   Hemoglobin 15.8  13.0 - 17.0 g/dL   HCT 19.1  47.8 - 29.5 %   MCV 96.3  78.0 - 100.0 fL   MCH 34.7 (*) 26.0 - 34.0 pg   MCHC 36.1 (*) 30.0 - 36.0 g/dL   RDW 62.1  30.8 - 65.7 %   Platelets 93 (*) 150 - 400 K/uL  COMPREHENSIVE METABOLIC PANEL     Status: Abnormal   Collection Time    03/23/13  4:14 AM      Result Value Range   Sodium 134 (*) 135 - 145 mEq/L   Potassium 2.8 (*) 3.5 - 5.1 mEq/L   Chloride 94 (*) 96 - 112 mEq/L   CO2 23  19 - 32 mEq/L   Glucose, Bld 133 (*) 70 - 99 mg/dL   BUN 4 (*) 6 - 23 mg/dL   Creatinine, Ser 8.46  0.50 - 1.35 mg/dL   Calcium 8.2 (*) 8.4 - 10.5 mg/dL   Total Protein 7.3  6.0 - 8.3 g/dL   Albumin 3.4 (*) 3.5 - 5.2 g/dL   AST 74 (*) 0 - 37 U/L   ALT 60 (*) 0 - 53 U/L   Alkaline Phosphatase 149 (*) 39 - 117 U/L   Total Bilirubin 0.3  0.3 - 1.2 mg/dL   GFR calc non Af Amer >90  >90 mL/min   GFR calc Af Amer >90  >90 mL/min  MAGNESIUM     Status: None   Collection Time    03/23/13  4:14 AM      Result Value Range   Magnesium 1.7  1.5 - 2.5 mg/dL  PROTIME-INR     Status: None   Collection Time    03/23/13  4:14 AM      Result Value Range   Prothrombin Time 12.3  11.6 - 15.2 seconds   INR 0.92  0.00 - 1.49  APTT     Status: None   Collection Time    03/23/13  4:14 AM      Result Value Range   aPTT 28  24 - 37 seconds  POCT I-STAT TROPONIN I     Status: None   Collection Time    03/23/13  4:30 AM      Result Value Range   Troponin i, poc 0.00  0.00 - 0.08 ng/mL   Comment 3             PORTABLE CHEST - 1 VIEW  Comparison: None.  Findings: Shallow inspiration. Cardiac enlargement. Pulmonary vascularity is obscured. There is perihilar infiltration at least  on the right. The left hilum is mostly obscured by the enlarged heart. Changes could represent  pneumonia or perihilar edema. No blunting of costophrenic angles. No pneumothorax. Calcification of the aorta.  IMPRESSION: Cardiac enlargement with perihilar infiltration suggesting pneumonia or edema.   Original Report Authenticated By: Burman Nieves, M.D.   EKG: sinus tach, normal axis, Q waves in inferior leads, no acute ischemic changes, normal intervals      MDM  Patient with multiple acute medical issues:  1. Acute COPD excacerbation. No infiltrate on CXR. Wheezing improved after multiple nebs. Pt has had prednisone.   2. ACS/chest pain: patient is pain free at this time and has been treated with ASA, NTG and MS. We will add Plavix. NO EKG changes and first trop is wnl. Will admit for full rule out and consideration for functional study.   3. Hypokalemia: we are supplementing po and iv  4. Alcohol withdrawal: we are treating with Ativan.   Hospitalist paged to request admission.         Brandt Loosen, MD 03/23/13 [phone]

## 2013-03-23 NOTE — ED Notes (Addendum)
Pt c/o sharp intermittent with inspiration chest pain. PT has been "having issues with his emphysema and HTN this week". Pt also c/o bilateral foot pain r/t recent fractures. Pt states "I feel like I am going through withdrawal symptoms". ETOH on board. Pt has expiratory wheezes bilaterally. Pt given 10mg  Albuteral .5 mg Atrovent PTA.

## 2013-03-23 NOTE — ED Notes (Signed)
Dr. Lavella Lemons at bedside for assessment

## 2013-03-23 NOTE — Progress Notes (Deleted)
  Echocardiogram 2D Echocardiogram has been performed.  Winda Summerall 03/23/2013, 4:03 PM

## 2013-03-24 LAB — BASIC METABOLIC PANEL
BUN: 5 mg/dL — ABNORMAL LOW (ref 6–23)
CO2: 27 mEq/L (ref 19–32)
Chloride: 98 mEq/L (ref 96–112)
Glucose, Bld: 80 mg/dL (ref 70–99)
Potassium: 4.2 mEq/L (ref 3.5–5.1)
Sodium: 135 mEq/L (ref 135–145)

## 2013-03-24 LAB — GLUCOSE, CAPILLARY: Glucose-Capillary: 116 mg/dL — ABNORMAL HIGH (ref 70–99)

## 2013-03-24 LAB — CBC
HCT: 43.4 % (ref 39.0–52.0)
Hemoglobin: 15.1 g/dL (ref 13.0–17.0)
MCHC: 34.8 g/dL (ref 30.0–36.0)
RBC: 4.38 MIL/uL (ref 4.22–5.81)

## 2013-03-24 MED ORDER — IPRATROPIUM BROMIDE 0.02 % IN SOLN
0.5000 mg | Freq: Three times a day (TID) | RESPIRATORY_TRACT | Status: DC
  Administered 2013-03-25: 0.5 mg via RESPIRATORY_TRACT
  Filled 2013-03-24: qty 2.5

## 2013-03-24 MED ORDER — SUCRALFATE 1 G PO TABS
1.0000 g | ORAL_TABLET | Freq: Three times a day (TID) | ORAL | Status: DC
  Administered 2013-03-24 – 2013-03-25 (×4): 1 g via ORAL
  Filled 2013-03-24 (×8): qty 1

## 2013-03-24 MED ORDER — NICOTINE 14 MG/24HR TD PT24
14.0000 mg | MEDICATED_PATCH | Freq: Every day | TRANSDERMAL | Status: DC
  Administered 2013-03-24: 14 mg via TRANSDERMAL
  Filled 2013-03-24 (×2): qty 1

## 2013-03-24 MED ORDER — ONDANSETRON HCL 4 MG/2ML IJ SOLN: 4.0000 mg | Freq: Three times a day (TID) | INTRAMUSCULAR | Status: DC | PRN

## 2013-03-24 MED ORDER — ALBUTEROL SULFATE (5 MG/ML) 0.5% IN NEBU
2.5000 mg | INHALATION_SOLUTION | Freq: Three times a day (TID) | RESPIRATORY_TRACT | Status: DC
  Administered 2013-03-25: 2.5 mg via RESPIRATORY_TRACT
  Filled 2013-03-24: qty 0.5

## 2013-03-24 MED ORDER — FOLIC ACID 1 MG PO TABS
1.0000 mg | ORAL_TABLET | Freq: Every day | ORAL | Status: DC
  Administered 2013-03-24: 1 mg via ORAL
  Filled 2013-03-24 (×2): qty 1

## 2013-03-24 MED ORDER — METHYLPREDNISOLONE SODIUM SUCC 40 MG IJ SOLR
40.0000 mg | Freq: Two times a day (BID) | INTRAMUSCULAR | Status: DC
  Administered 2013-03-24 (×2): 40 mg via INTRAVENOUS
  Filled 2013-03-24 (×4): qty 1

## 2013-03-24 MED ORDER — BUDESONIDE 0.25 MG/2ML IN SUSP
0.2500 mg | Freq: Two times a day (BID) | RESPIRATORY_TRACT | Status: DC
  Administered 2013-03-24 (×2): 0.25 mg via RESPIRATORY_TRACT
  Filled 2013-03-24 (×6): qty 2

## 2013-03-24 MED ORDER — LORAZEPAM 2 MG/ML IJ SOLN
0.0000 mg | Freq: Four times a day (QID) | INTRAMUSCULAR | Status: DC
  Administered 2013-03-24: 2 mg via INTRAVENOUS

## 2013-03-24 MED ORDER — ADULT MULTIVITAMIN W/MINERALS CH
1.0000 | ORAL_TABLET | Freq: Every day | ORAL | Status: DC
  Administered 2013-03-24: 1 via ORAL
  Filled 2013-03-24 (×2): qty 1

## 2013-03-24 MED ORDER — VITAMIN B-1 100 MG PO TABS
100.0000 mg | ORAL_TABLET | Freq: Every day | ORAL | Status: DC
  Administered 2013-03-24: 100 mg via ORAL
  Filled 2013-03-24 (×2): qty 1

## 2013-03-24 MED ORDER — ONDANSETRON HCL 4 MG/2ML IJ SOLN
INTRAMUSCULAR | Status: AC
  Filled 2013-03-24: qty 2

## 2013-03-24 MED ORDER — LORAZEPAM 2 MG/ML IJ SOLN
1.0000 mg | Freq: Four times a day (QID) | INTRAMUSCULAR | Status: DC | PRN
  Administered 2013-03-24: 1 mg via INTRAVENOUS
  Filled 2013-03-24: qty 1

## 2013-03-24 MED ORDER — LORAZEPAM 1 MG PO TABS: 1.0000 mg | ORAL_TABLET | Freq: Four times a day (QID) | ORAL | Status: DC | PRN

## 2013-03-24 MED ORDER — PANTOPRAZOLE SODIUM 40 MG PO TBEC
40.0000 mg | DELAYED_RELEASE_TABLET | Freq: Every day | ORAL | Status: DC
  Administered 2013-03-24: 40 mg via ORAL
  Filled 2013-03-24: qty 1

## 2013-03-24 MED ORDER — THIAMINE HCL 100 MG/ML IJ SOLN
100.0000 mg | Freq: Every day | INTRAMUSCULAR | Status: DC
  Filled 2013-03-24 (×2): qty 1

## 2013-03-24 MED ORDER — LORAZEPAM 2 MG/ML IJ SOLN: 0.0000 mg | Freq: Two times a day (BID) | INTRAMUSCULAR | Status: DC

## 2013-03-24 NOTE — Progress Notes (Signed)
[facility] PROGRESS NOTE  Ruben Sutton LOV:[id number] DOB: 1976-05-04 DOA: 03/23/2013 PCP: [name], MD  Assessment/Plan: 1-CP/SOB: most likely GI in nature with some discomfort from constant cough with acute COPD exacerbation.  -follow 2-D echo -CE'z negative X3 -started on PPI -steroids, nebulizer, abx's and pulmicort for COPD exacerbation -lipase negative  2-Alcohol abuse: continue CIWA, folic acid and thiamine. Mild to moderate symptoms of withdrawal. Counseling provided.  3-COPD: as mentioned above. Steroids, nebulizer treatment and abx's  4-GERD: continue PPI and carafate  5-hypokalemia: will follow BMET. repleted on admission. K 4.2 today (4/1)  6-HTN: continue lisinopril  7-right ankle trauma: continue PRN analgesics.  8-Thrombocytopenia:platelest stable. No signs of bleeding. Due to alcohol abuse. Will continue to monitor.  9-tobacco abuse: started on nicotine. Cessation counseling provided.  OAC:[id number]  Code Status: Full Family Communication: no family at bedside Disposition Plan: home when medically stable   Consultants:  none  Procedures:  2-d echo pending  Antibiotics:  levaquin  HPI/Subjective: Clammy, with significant wheezing and unable to speak in full sentences.  Objective: Filed Vitals:   03/23/13 1400 03/23/13 2100 03/24/13 0422 03/24/13 1004  BP: 140/85 156/88 172/113 155/97  Pulse: 103 80 85   Temp: 98.5 F (36.9 C) 97.7 F (36.5 C) 97.5 F (36.4 C)   TempSrc: Oral Oral Oral   Resp: 18 20 20    Weight:   105.144 kg (231 lb 12.8 oz)   SpO2: 93% 94% 98%     Intake/Output Summary (Last 24 hours) at 03/24/13 1147 Last data filed at 03/24/13 1146  Gross per 24 hour  Intake    480 ml  Output   3425 ml  Net  -2945 ml   Filed Weights   03/24/13 0422  Weight: 105.144 kg (231 lb 12.8 oz)    Exam:   General:  Difficulty breathing, unable to speak in full sentences, clammy and with coughing  speels  Cardiovascular: mild tachycardia, no rubs or gallops  Respiratory: diffuse exp wheezing, no crackles, diffuse rhonchi; fair air movement  Abdomen: soft, NT, ND, positive BS  Musculoskeletal: right ankle, swollen and mild bruise from recent traumatic injury; no other joint swelling or erythema.  Neuro: non focal deficit.  Data Reviewed: Basic Metabolic Panel:  Recent Labs Lab 03/23/13 0414 03/23/13 0733 03/24/13 0507  NA 134* 136 135  K 2.8* 3.0* 4.2  CL 94* 98 98  CO2 23 20 27   GLUCOSE 133* 161* 80  BUN 4* 3* 5*  CREATININE 0.75 0.71 0.77  CALCIUM 8.2* 7.5* 8.8  MG 1.7  --   --    Liver Function Tests:  Recent Labs Lab 03/23/13 0414  AST 74*  ALT 60*  ALKPHOS 149*  BILITOT 0.3  PROT 7.3  ALBUMIN 3.4*    Recent Labs Lab 03/23/13 0733  LIPASE 42   CBC:  Recent Labs Lab 03/23/13 0414 03/24/13 0507  WBC 3.6* 4.1  HGB 15.8 15.1  HCT 43.8 43.4  MCV 96.3 99.1  PLT 93* 94*   Cardiac Enzymes:  Recent Labs Lab 03/23/13 0733 03/23/13 1241 03/23/13 1916  TROPONINI <0.30 <0.30 <0.30   Studies: Dg Chest Portable 1 View  03/23/2013  *RADIOLOGY REPORT*  Clinical Data: Chest pain and shortness of breath.  PORTABLE CHEST - 1 VIEW  Comparison: 03/07/2013  Findings: Postoperative changes in the mediastinum. The heart size and pulmonary vascularity are normal. The lungs appear clear and expanded without focal air space disease or consolidation. No blunting of the costophrenic angles.  No pneumothorax.  Mediastinal contours appear intact.  No significant change since previous study.  IMPRESSION: No evidence of active pulmonary disease.   Original Report Authenticated By: Burman Nieves, M.D.     Scheduled Meds: . ipratropium  0.5 mg Nebulization Q4H   And  . albuterol  2.5 mg Nebulization Q4H  . aspirin EC  81 mg Oral Daily  . budesonide (PULMICORT) nebulizer solution  0.25 mg Nebulization BID  . enoxaparin (LOVENOX) injection  40 mg Subcutaneous  Q24H  . folic acid  1 mg Oral Daily  . levofloxacin (LEVAQUIN) IV  500 mg Intravenous Q24H  . lisinopril  10 mg Oral Daily  . LORazepam  0-4 mg Intravenous Q6H   Followed by  . [START ON 03/26/2013] LORazepam  0-4 mg Intravenous Q12H  . methylPREDNISolone (SOLU-MEDROL) injection  40 mg Intravenous Q12H  . multivitamin with minerals  1 tablet Oral Daily  . pantoprazole  40 mg Oral Q1200  . sodium chloride  3 mL Intravenous Q12H  . thiamine  100 mg Oral Daily   Or  . thiamine  100 mg Intravenous Daily   Continuous Infusions:   Principal Problem:   Epigastric pain Active Problems:   Pancreatitis, alcoholic, acute   HTN (hypertension)   Bronchitis, acute, with bronchospasm   Alcohol withdrawal syndrome   Alcohol abuse   Acute bronchitis   Nausea and vomiting in adult   Transaminitis   Thrombocytopenia   COPD exacerbation   Chest pain   Tobacco abuse    Time spent: >30 minutes    Liadan Guizar  [facility] Pager [phone]. If 7PM-7AM, please contact night-coverage at [email or web address], password [facility] 03/24/2013, 11:47 AM  LOS: 1 day

## 2013-03-24 NOTE — Progress Notes (Signed)
  Echocardiogram 2D Echocardiogram has been performed.  Ellender Hose A 03/24/2013, 9:56 AM

## 2013-03-25 MED ORDER — LISINOPRIL 10 MG PO TABS: 10.0000 mg | ORAL_TABLET | Freq: Every day | ORAL | Status: DC

## 2013-03-25 MED ORDER — ALBUTEROL SULFATE HFA 108 (90 BASE) MCG/ACT IN AERS: 2.0000 | INHALATION_SPRAY | Freq: Four times a day (QID) | RESPIRATORY_TRACT | Status: DC | PRN

## 2013-03-25 MED ORDER — OXYCODONE-ACETAMINOPHEN 5-325 MG PO TABS: 1.0000 | ORAL_TABLET | Freq: Four times a day (QID) | ORAL | Status: DC | PRN

## 2013-03-25 MED ORDER — ONDANSETRON 4 MG PO TBDP: 4.0000 mg | ORAL_TABLET | Freq: Three times a day (TID) | ORAL | Status: DC | PRN

## 2013-03-25 MED ORDER — FOLIC ACID 1 MG PO TABS: 1.0000 mg | ORAL_TABLET | Freq: Every day | ORAL | Status: DC

## 2013-03-25 MED ORDER — LEVOFLOXACIN 500 MG PO TABS: 500.0000 mg | ORAL_TABLET | Freq: Every day | ORAL | Status: DC

## 2013-03-25 MED ORDER — ADULT MULTIVITAMIN W/MINERALS CH: 1.0000 | ORAL_TABLET | Freq: Every day | ORAL | Status: DC

## 2013-03-25 MED ORDER — THIAMINE HCL 100 MG PO TABS: 100.0000 mg | ORAL_TABLET | Freq: Every day | ORAL | Status: DC

## 2013-03-25 MED ORDER — PREDNISONE (PAK) 10 MG PO TABS: 10.0000 mg | ORAL_TABLET | Freq: Every day | ORAL | Status: DC

## 2013-03-25 NOTE — Discharge Summary (Signed)
Physician Discharge Summary  Ruben Sutton:[id number] DOB: 1976-07-17 DOA: 03/23/2013  PCP: [name], MD  Admit date: 03/23/2013 Discharge date: 03/25/2013  Time spent: > 35 minutes  Recommendations for Outpatient Follow-up:  1. Follow up with your primary care physician in 1-2 weeks or sooner 2. Also lisinopril was started this admission patient will need BMP upon hospital follow up 3. Continue to encourage smoking cessation  Discharge Diagnoses:  Principal Problem:   Epigastric pain Active Problems:   Pancreatitis, alcoholic, acute   HTN (hypertension)   Bronchitis, acute, with bronchospasm   Alcohol withdrawal syndrome   Alcohol abuse   Acute bronchitis   Nausea and vomiting in adult   Transaminitis   Thrombocytopenia   COPD exacerbation   Chest pain   Tobacco abuse   Discharge Condition: stable  Diet recommendation: Low sodium diet  Filed Weights   03/24/13 0422 03/25/13 0500  Weight: 105.144 kg (231 lb 12.8 oz) 105 kg (231 lb 7.7 oz)    History of present illness:  Please see HPI for details  37 y/o with h/o HTN off antihypertensives at home, alcohol dependence, nicotine dependence who presented with chest discomfort as well as SOB and nausea.  Hospital Course:  1-CP/SOB: most likely GI in nature with some discomfort from constant cough with acute COPD exacerbation.  -2-D echo reported as normal study within technical limitations with EF of 55-60% with no diagnostic regional wall motion abnormality. -CE'z negative X 4  -steroids, nebulizer, abx's and pulmicort for COPD exacerbation  -lipase negative  - Chest pain most likely 2ary to COPD exacerbation.  2-Alcohol abuse:  - recommended cessation. Was placed on CIWA protocol while in house.  3-COPD:  - D/c home on prednisone taper - Levaquin to complete 5 days of antibiotic therapy - Albuterol HFA prescribed.  4-hypokalemia:  - resolved  5-HTN:  -continue lisinopril as outpatient.  Will  recommend f/u BMP.  His creatinine on last check was 0.77 while in house.  6-right ankle trauma:  - continue PRN analgesics. Recommend f/u with pcp  7-Thrombocytopenia: Platelest stable. No signs of bleeding. Due to alcohol abuse. Will continue to monitor.   8-tobacco abuse:  - Recommended tobacco cessation   DVT prophylaxis: patient was on lovenox while in house.  Procedures:  Echocardiogram please see above and/or refer to EMR for details.  Consultations:  None  Discharge Exam: Filed Vitals:   03/24/13 1004 03/24/13 1439 03/24/13 2028 03/25/13 0500  BP: 155/97 153/87 163/95 159/113  Pulse:  83 101 79  Temp:  98.4 F (36.9 C) 97.7 F (36.5 C) 97.5 F (36.4 C)  TempSrc:  Oral    Resp:  18 18 18   Weight:    105 kg (231 lb 7.7 oz)  SpO2:  93% 95% 96%    General: Pt in NAD, Alert and Awake Cardiovascular: RRR, No MRG Respiratory: mild expiratory wheezes BL, no rhales, breath sounds BL, breathing comfortably on room air.  Discharge Instructions  Discharge Orders   Future Orders Complete By Expires     Call MD for:  difficulty breathing, headache or visual disturbances  As directed     Call MD for:  extreme fatigue  As directed     Call MD for:  temperature >100.4  As directed     Diet - low sodium heart healthy  As directed     Discharge instructions  As directed     Comments:      Please be sure to follow up  with your primary care physician in 1-2 days or sooner should any new concerns arise.  Also since you have been started on a new blood pressure medication you will need to have lab work (BMP) on follow up with your pcp after discharge.    Increase activity slowly  As directed         Medication List    STOP taking these medications       ibuprofen 200 MG tablet  Commonly known as:  ADVIL,MOTRIN      TAKE these medications       albuterol 108 (90 BASE) MCG/ACT inhaler  Commonly known as:  PROVENTIL HFA;VENTOLIN HFA  Inhale 2 puffs into the lungs  every 6 (six) hours as needed for wheezing.     folic acid 1 MG tablet  Commonly known as:  FOLVITE  Take 1 tablet (1 mg total) by mouth daily.     levofloxacin 500 MG tablet  Commonly known as:  LEVAQUIN  Take 1 tablet (500 mg total) by mouth daily.     lisinopril 10 MG tablet  Commonly known as:  PRINIVIL,ZESTRIL  Take 1 tablet (10 mg total) by mouth daily.     multivitamin with minerals Tabs  Take 1 tablet by mouth daily.     ondansetron 4 MG disintegrating tablet  Commonly known as:  ZOFRAN ODT  Take 1 tablet (4 mg total) by mouth every 8 (eight) hours as needed for nausea.     oxyCODONE-acetaminophen 5-325 MG per tablet  Commonly known as:  PERCOCET/ROXICET  Take 1 tablet by mouth every 6 (six) hours as needed.     predniSONE 10 MG tablet  Commonly known as:  STERAPRED UNI-PAK  Take 1 tablet (10 mg total) by mouth daily. Take 50 mg (5 tabs) orally for the next 3 days, then take 40 mg (4 tabs) orally for the next 3 days, then take 30 mg (3 tabs) orally for the next 3 days, then take 20 mg (2 tabs) orally for the next 3 days, then take 10 mg orally for the next 3 days.     thiamine 100 MG tablet  Take 1 tablet (100 mg total) by mouth daily.          The results of significant diagnostics from this hospitalization (including imaging, microbiology, ancillary and laboratory) are listed below for reference.    Significant Diagnostic Studies: Dg Chest 2 View  03/07/2013  *RADIOLOGY REPORT*  Clinical Data: Chest pain  CHEST - 2 VIEW  Comparison: 10/21/2012  Findings: Postoperative changes in the mediastinum are stable. The heart size and pulmonary vascularity are normal. The lungs appear clear and expanded without focal air space disease or consolidation. No blunting of the costophrenic angles.  No pneumothorax.  Mediastinal contours appear intact.  No significant change since the previous study.  IMPRESSION: No evidence of active pulmonary disease.   Original Report  Authenticated By: Burman Nieves, M.D.    Dg Ankle Complete Right  02/25/2013  *RADIOLOGY REPORT*  Clinical Data: Twisted right ankle 4 days ago, fall, pain, swelling and bruising, old fracture 1 year ago  RIGHT ANKLE - COMPLETE 3+ VIEW  Comparison: 04/09/2029  Findings: Ankle mortise intact. Deformity of the distal fibula post healed fracture. Osseous mineralization grossly normal. Lateral soft tissue swelling. On the lateral view, a sharply defined linear lucency is seen at the lateral malleolus, uncertain if represents sequela of prior lateral malleolar fracture versus nondisplaced recurrent acute fracture. No additional potential fracture or  dislocation seen.  IMPRESSION: Post-traumatic deformity distal right fibula. Transverse linear lucency at the lateral malleolus, sharply defined, question sequela of previous fracture versus nondisplaced recurrent acute fracture.   Original Report Authenticated By: Ulyses Southward, M.D.    Ct Angio Chest Pe W/cm &/or Wo Cm  03/07/2013  *RADIOLOGY REPORT*  Clinical Data: Chest pain  CT ANGIOGRAPHY CHEST  Technique:  Multidetector CT imaging of the chest using the standard protocol during bolus administration of intravenous contrast. Multiplanar reconstructed images including MIPs were obtained and reviewed to evaluate the vascular anatomy.  Contrast: OMNIPAQUE IOHEXOL 350 MG/ML SOLN  Comparison: 04/25/2010  Findings: There is suboptimal opacification of the pulmonary arteries after two injections markedly limiting this exam.  No obvious filling defects are present in the pulmonary arterial tree to suggest acute pulmonary thromboembolism.  No abnormal mediastinal adenopathy or pericardial effusion.  No pneumothorax and no pleural effusion.  Irregular patchy parenchymal opacity are present at the dependent portion of both lung bases left greater than right.  Subsegmental atelectasis is also present. Reticular markings in the anterior right upper and left upper lobes have  a chronic appearance.  There are chronic fractures of the spinous processes of T4, T5, and T6.  No acute bony deformity.  Diffuse hepatic steatosis.  IMPRESSION: No evidence of acute pulmonary thromboembolism.  Patchy bibasilar atelectasis verses airspace disease left greater than right as described.  Diffuse hepatic steatosis.   Original Report Authenticated By: Jolaine Click, M.D.    Dg Chest Portable 1 View  03/23/2013  *RADIOLOGY REPORT*  Clinical Data: Chest pain and shortness of breath.  PORTABLE CHEST - 1 VIEW  Comparison: 03/07/2013  Findings: Postoperative changes in the mediastinum. The heart size and pulmonary vascularity are normal. The lungs appear clear and expanded without focal air space disease or consolidation. No blunting of the costophrenic angles.  No pneumothorax.  Mediastinal contours appear intact.  No significant change since previous study.  IMPRESSION: No evidence of active pulmonary disease.   Original Report Authenticated By: Burman Nieves, M.D.    Dg Foot Complete Left  03/07/2013  *RADIOLOGY REPORT*  Clinical Data: Blunt trauma to the foot.  Pain, contusions, especially in the region of the toes.  LEFT FOOT - COMPLETE 3+ VIEW  Comparison: None  Findings: There are fractures of the second, third, and fourth metatarsals, associated with mild displacement.  There is associated soft tissue swelling along the dorsum of the foot.  No other fractures are identified.  IMPRESSION: Fractures of the second, third, fourth metatarsals.   Original Report Authenticated By: Norva Pavlov, M.D.     Microbiology: No results found for this or any previous visit (from the past 240 hour(s)).   Labs: Basic Metabolic Panel:  Recent Labs Lab 03/23/13 0414 03/23/13 0733 03/24/13 0507  NA 134* 136 135  K 2.8* 3.0* 4.2  CL 94* 98 98  CO2 23 20 27   GLUCOSE 133* 161* 80  BUN 4* 3* 5*  CREATININE 0.75 0.71 0.77  CALCIUM 8.2* 7.5* 8.8  MG 1.7  --   --    Liver Function  Tests:  Recent Labs Lab 03/23/13 0414  AST 74*  ALT 60*  ALKPHOS 149*  BILITOT 0.3  PROT 7.3  ALBUMIN 3.4*    Recent Labs Lab 03/23/13 0733  LIPASE 42   No results found for this basename: AMMONIA,  in the last 168 hours CBC:  Recent Labs Lab 03/23/13 0414 03/24/13 0507  WBC 3.6* 4.1  HGB 15.8 15.1  HCT 43.8 43.4  MCV 96.3 99.1  PLT 93* 94*   Cardiac Enzymes:  Recent Labs Lab 03/23/13 0733 03/23/13 1241 03/23/13 1916  TROPONINI <0.30 <0.30 <0.30   BNP: BNP (last 3 results) No results found for this basename: PROBNP,  in the last 8760 hours CBG:  Recent Labs Lab 03/24/13 1620  GLUCAP 116*       Signed:  Penny Pia  [facility] 03/25/2013, 9:38 AM

## 2013-03-25 NOTE — Progress Notes (Signed)
D/c orders received;IV removed with gauze on, pt remains in stable condition, pt meds and instructions reviewed and given to pt; pt d/c to home 
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
## 2013-08-02 ENCOUNTER — Encounter (HOSPITAL_COMMUNITY): Payer: Self-pay | Admitting: *Deleted

## 2013-08-02 ENCOUNTER — Inpatient Hospital Stay (HOSPITAL_COMMUNITY): Payer: Medicaid Other

## 2013-08-02 ENCOUNTER — Inpatient Hospital Stay (HOSPITAL_COMMUNITY)
Admission: EM | Admit: 2013-08-02 | Discharge: 2013-08-05 | DRG: 190 | Disposition: A | Discharge status: Home / Self Care | Payer: Medicaid Other | Attending: Internal Medicine | Admitting: Internal Medicine

## 2013-08-02 ENCOUNTER — Emergency Department (HOSPITAL_COMMUNITY): Payer: Medicaid Other

## 2013-08-02 DIAGNOSIS — G8929 Other chronic pain: Secondary | ICD-10-CM | POA: Diagnosis present

## 2013-08-02 DIAGNOSIS — Z7982 Long term (current) use of aspirin: Secondary | ICD-10-CM

## 2013-08-02 DIAGNOSIS — IMO0002 Reserved for concepts with insufficient information to code with codable children: Secondary | ICD-10-CM

## 2013-08-02 DIAGNOSIS — E876 Hypokalemia: Secondary | ICD-10-CM | POA: Diagnosis present

## 2013-08-02 DIAGNOSIS — K852 Alcohol induced acute pancreatitis without necrosis or infection: Secondary | ICD-10-CM

## 2013-08-02 DIAGNOSIS — R112 Nausea with vomiting, unspecified: Secondary | ICD-10-CM

## 2013-08-02 DIAGNOSIS — Z720 Tobacco use: Secondary | ICD-10-CM

## 2013-08-02 DIAGNOSIS — R1013 Epigastric pain: Secondary | ICD-10-CM

## 2013-08-02 DIAGNOSIS — E860 Dehydration: Secondary | ICD-10-CM

## 2013-08-02 DIAGNOSIS — F10929 Alcohol use, unspecified with intoxication, unspecified: Secondary | ICD-10-CM

## 2013-08-02 DIAGNOSIS — F10230 Alcohol dependence with withdrawal, uncomplicated: Secondary | ICD-10-CM

## 2013-08-02 DIAGNOSIS — D696 Thrombocytopenia, unspecified: Secondary | ICD-10-CM

## 2013-08-02 DIAGNOSIS — F1010 Alcohol abuse, uncomplicated: Secondary | ICD-10-CM

## 2013-08-02 DIAGNOSIS — F10939 Alcohol use, unspecified with withdrawal, unspecified: Secondary | ICD-10-CM

## 2013-08-02 DIAGNOSIS — Z951 Presence of aortocoronary bypass graft: Secondary | ICD-10-CM

## 2013-08-02 DIAGNOSIS — F17200 Nicotine dependence, unspecified, uncomplicated: Secondary | ICD-10-CM

## 2013-08-02 DIAGNOSIS — J189 Pneumonia, unspecified organism: Secondary | ICD-10-CM

## 2013-08-02 DIAGNOSIS — R7401 Elevation of levels of liver transaminase levels: Secondary | ICD-10-CM

## 2013-08-02 DIAGNOSIS — R062 Wheezing: Secondary | ICD-10-CM | POA: Diagnosis present

## 2013-08-02 DIAGNOSIS — F10930 Alcohol use, unspecified with withdrawal, uncomplicated: Secondary | ICD-10-CM

## 2013-08-02 DIAGNOSIS — I10 Essential (primary) hypertension: Secondary | ICD-10-CM

## 2013-08-02 DIAGNOSIS — R000 Tachycardia, unspecified: Secondary | ICD-10-CM | POA: Diagnosis present

## 2013-08-02 DIAGNOSIS — R079 Chest pain, unspecified: Secondary | ICD-10-CM

## 2013-08-02 DIAGNOSIS — F10239 Alcohol dependence with withdrawal, unspecified: Secondary | ICD-10-CM | POA: Diagnosis present

## 2013-08-02 DIAGNOSIS — I2510 Atherosclerotic heart disease of native coronary artery without angina pectoris: Secondary | ICD-10-CM | POA: Diagnosis present

## 2013-08-02 DIAGNOSIS — J441 Chronic obstructive pulmonary disease with (acute) exacerbation: Principal | ICD-10-CM

## 2013-08-02 DIAGNOSIS — J209 Acute bronchitis, unspecified: Secondary | ICD-10-CM

## 2013-08-02 DIAGNOSIS — F1020 Alcohol dependence, uncomplicated: Secondary | ICD-10-CM | POA: Diagnosis present

## 2013-08-02 DIAGNOSIS — K859 Acute pancreatitis without necrosis or infection, unspecified: Secondary | ICD-10-CM | POA: Diagnosis present

## 2013-08-02 HISTORY — DX: Headache: R51

## 2013-08-02 HISTORY — DX: Depression, unspecified: F32.A

## 2013-08-02 HISTORY — DX: Gastro-esophageal reflux disease without esophagitis: K21.9

## 2013-08-02 HISTORY — DX: Major depressive disorder, single episode, unspecified: F32.9

## 2013-08-02 HISTORY — DX: Anxiety disorder, unspecified: F41.9

## 2013-08-02 HISTORY — DX: Pneumonia, unspecified organism: J18.9

## 2013-08-02 LAB — CBC
MCH: 35.1 pg — ABNORMAL HIGH (ref 26.0–34.0)
Platelets: 150 10*3/uL (ref 150–400)
Platelets: 164 10*3/uL (ref 150–400)
RBC: 4.82 MIL/uL (ref 4.22–5.81)
RBC: 4.79 MIL/uL (ref 4.22–5.81)
RDW: 13.9 % (ref 11.5–15.5)
RDW: 13.9 % (ref 11.5–15.5)
WBC: 10.7 10*3/uL — ABNORMAL HIGH (ref 4.0–10.5)

## 2013-08-02 LAB — POCT I-STAT 3, ART BLOOD GAS (G3+)
Bicarbonate: 20.1 mEq/L (ref 20.0–24.0)
O2 Saturation: 92 %
pCO2 arterial: 43.6 mmHg (ref 35.0–45.0)
pO2, Arterial: 73 mmHg — ABNORMAL LOW (ref 80.0–100.0)

## 2013-08-02 LAB — RAPID URINE DRUG SCREEN, HOSP PERFORMED
Barbiturates: NOT DETECTED
Cocaine: NOT DETECTED
Opiates: POSITIVE — AB

## 2013-08-02 LAB — COMPREHENSIVE METABOLIC PANEL
ALT: 66 U/L — ABNORMAL HIGH (ref 0–53)
AST: 56 U/L — ABNORMAL HIGH (ref 0–37)
Albumin: 3.2 g/dL — ABNORMAL LOW (ref 3.5–5.2)
CO2: 23 mEq/L (ref 19–32)
Chloride: 93 mEq/L — ABNORMAL LOW (ref 96–112)
GFR calc non Af Amer: >90 mL/min (ref >90)
Sodium: 132 mEq/L — ABNORMAL LOW (ref 135–145)
Total Bilirubin: 0.8 mg/dL (ref 0.3–1.2)

## 2013-08-02 LAB — TSH: TSH: 3.674 u[IU]/mL (ref 0.350–4.500)

## 2013-08-02 LAB — MRSA PCR SCREENING: MRSA by PCR: NEGATIVE

## 2013-08-02 LAB — INFLUENZA PANEL BY PCR (TYPE A & B)
H1N1 flu by pcr: NOT DETECTED
Influenza A By PCR: NEGATIVE
Influenza B By PCR: NEGATIVE

## 2013-08-02 LAB — LACTIC ACID, PLASMA: Lactic Acid, Venous: 3.1 mmol/L — ABNORMAL HIGH (ref 0.5–2.2)

## 2013-08-02 LAB — CREATININE, SERUM: Creatinine, Ser: 0.95 mg/dL (ref 0.50–1.35)

## 2013-08-02 LAB — TROPONIN I: Troponin I: <0.3 ng/mL (ref <0.30)

## 2013-08-02 MED ORDER — IPRATROPIUM BROMIDE 0.02 % IN SOLN
0.5000 mg | Freq: Four times a day (QID) | RESPIRATORY_TRACT | Status: DC
  Administered 2013-08-02 – 2013-08-05 (×11): 0.5 mg via RESPIRATORY_TRACT
  Filled 2013-08-02 (×13): qty 2.5

## 2013-08-02 MED ORDER — POTASSIUM CHLORIDE IN NACL 40-0.9 MEQ/L-% IV SOLN
INTRAVENOUS | Status: DC
  Administered 2013-08-02: 12:00:00 via INTRAVENOUS
  Filled 2013-08-02 (×4): qty 1000

## 2013-08-02 MED ORDER — HYDROMORPHONE HCL PF 1 MG/ML IJ SOLN
1.0000 mg | INTRAMUSCULAR | Status: AC | PRN
  Administered 2013-08-02: 1 mg via INTRAVENOUS
  Filled 2013-08-02: qty 1

## 2013-08-02 MED ORDER — LORAZEPAM 2 MG/ML IJ SOLN
1.0000 mg | Freq: Four times a day (QID) | INTRAMUSCULAR | Status: DC | PRN
  Filled 2013-08-02: qty 1

## 2013-08-02 MED ORDER — FOLIC ACID 1 MG PO TABS
1.0000 mg | ORAL_TABLET | Freq: Every day | ORAL | Status: DC
  Administered 2013-08-02 – 2013-08-05 (×4): 1 mg via ORAL
  Filled 2013-08-02 (×4): qty 1

## 2013-08-02 MED ORDER — ASPIRIN EC 81 MG PO TBEC
81.0000 mg | DELAYED_RELEASE_TABLET | Freq: Every day | ORAL | Status: DC
  Administered 2013-08-03 – 2013-08-05 (×3): 81 mg via ORAL
  Filled 2013-08-02 (×3): qty 1

## 2013-08-02 MED ORDER — SODIUM CHLORIDE 0.9 % IJ SOLN
3.0000 mL | Freq: Two times a day (BID) | INTRAMUSCULAR | Status: DC
  Administered 2013-08-03 – 2013-08-05 (×5): 3 mL via INTRAVENOUS

## 2013-08-02 MED ORDER — ACETAMINOPHEN 325 MG PO TABS: 650.0000 mg | ORAL_TABLET | Freq: Four times a day (QID) | ORAL | Status: DC | PRN

## 2013-08-02 MED ORDER — THIAMINE HCL 100 MG/ML IJ SOLN
Freq: Once | INTRAVENOUS | Status: AC
  Administered 2013-08-02: 13:00:00 via INTRAVENOUS
  Filled 2013-08-02: qty 1000

## 2013-08-02 MED ORDER — DEXTROSE 5 % IV SOLN
1.0000 g | INTRAVENOUS | Status: DC
  Administered 2013-08-02 – 2013-08-05 (×4): 1 g via INTRAVENOUS
  Filled 2013-08-02 (×4): qty 10

## 2013-08-02 MED ORDER — METHYLPREDNISOLONE SODIUM SUCC 40 MG IJ SOLR
40.0000 mg | Freq: Three times a day (TID) | INTRAMUSCULAR | Status: DC
  Administered 2013-08-02 – 2013-08-03 (×5): 40 mg via INTRAVENOUS
  Filled 2013-08-02 (×7): qty 1

## 2013-08-02 MED ORDER — LEVALBUTEROL HCL 0.63 MG/3ML IN NEBU
0.6300 mg | INHALATION_SOLUTION | Freq: Four times a day (QID) | RESPIRATORY_TRACT | Status: DC
  Administered 2013-08-02 – 2013-08-05 (×11): 0.63 mg via RESPIRATORY_TRACT
  Filled 2013-08-02 (×29): qty 3

## 2013-08-02 MED ORDER — IOHEXOL 350 MG/ML SOLN
80.0000 mL | Freq: Once | INTRAVENOUS | Status: AC | PRN
  Administered 2013-08-02: 80 mL via INTRAVENOUS

## 2013-08-02 MED ORDER — AZITHROMYCIN 500 MG IV SOLR
500.0000 mg | INTRAVENOUS | Status: DC
  Administered 2013-08-02 – 2013-08-03 (×2): 500 mg via INTRAVENOUS
  Filled 2013-08-02 (×2): qty 500

## 2013-08-02 MED ORDER — HYDROMORPHONE HCL PF 1 MG/ML IJ SOLN
1.0000 mg | Freq: Once | INTRAMUSCULAR | Status: AC
  Administered 2013-08-02: 1 mg via INTRAVENOUS
  Filled 2013-08-02: qty 1

## 2013-08-02 MED ORDER — ONDANSETRON HCL 4 MG PO TABS: 4.0000 mg | ORAL_TABLET | Freq: Four times a day (QID) | ORAL | Status: DC | PRN

## 2013-08-02 MED ORDER — ONDANSETRON HCL 4 MG/2ML IJ SOLN: 4.0000 mg | Freq: Four times a day (QID) | INTRAMUSCULAR | Status: DC | PRN

## 2013-08-02 MED ORDER — VITAMIN B-1 100 MG PO TABS
100.0000 mg | ORAL_TABLET | Freq: Every day | ORAL | Status: DC
  Administered 2013-08-02 – 2013-08-05 (×4): 100 mg via ORAL
  Filled 2013-08-02 (×4): qty 1

## 2013-08-02 MED ORDER — ONDANSETRON HCL 4 MG/2ML IJ SOLN: 4.0000 mg | Freq: Three times a day (TID) | INTRAMUSCULAR | Status: DC | PRN

## 2013-08-02 MED ORDER — MAGNESIUM SULFATE 40 MG/ML IJ SOLN
2.0000 g | Freq: Once | INTRAMUSCULAR | Status: AC
  Administered 2013-08-02: 2 g via INTRAVENOUS
  Filled 2013-08-02: qty 50

## 2013-08-02 MED ORDER — THIAMINE HCL 100 MG/ML IJ SOLN
Freq: Once | INTRAVENOUS | Status: AC
  Administered 2013-08-02: 12:00:00 via INTRAVENOUS
  Filled 2013-08-02: qty 1000

## 2013-08-02 MED ORDER — SODIUM CHLORIDE 0.9 % IV SOLN: INTRAVENOUS | Status: DC

## 2013-08-02 MED ORDER — THIAMINE HCL 100 MG/ML IJ SOLN
100.0000 mg | Freq: Every day | INTRAMUSCULAR | Status: DC
  Filled 2013-08-02 (×2): qty 1

## 2013-08-02 MED ORDER — LORAZEPAM 2 MG/ML IJ SOLN
2.0000 mg | Freq: Once | INTRAMUSCULAR | Status: AC
  Administered 2013-08-02: 2 mg via INTRAVENOUS

## 2013-08-02 MED ORDER — PANTOPRAZOLE SODIUM 40 MG IV SOLR
40.0000 mg | Freq: Two times a day (BID) | INTRAVENOUS | Status: DC
  Administered 2013-08-02 – 2013-08-03 (×3): 40 mg via INTRAVENOUS
  Filled 2013-08-02 (×4): qty 40

## 2013-08-02 MED ORDER — IPRATROPIUM BROMIDE 0.02 % IN SOLN
0.5000 mg | RESPIRATORY_TRACT | Status: DC
  Administered 2013-08-02: 0.5 mg via RESPIRATORY_TRACT
  Filled 2013-08-02: qty 2.5

## 2013-08-02 MED ORDER — ACETAMINOPHEN 650 MG RE SUPP: 650.0000 mg | Freq: Four times a day (QID) | RECTAL | Status: DC | PRN

## 2013-08-02 MED ORDER — ALBUTEROL SULFATE (5 MG/ML) 0.5% IN NEBU
2.5000 mg | INHALATION_SOLUTION | RESPIRATORY_TRACT | Status: DC
  Administered 2013-08-02: 2.5 mg via RESPIRATORY_TRACT
  Filled 2013-08-02: qty 0.5

## 2013-08-02 MED ORDER — ENOXAPARIN SODIUM 40 MG/0.4ML ~~LOC~~ SOLN
40.0000 mg | SUBCUTANEOUS | Status: DC
  Administered 2013-08-02 – 2013-08-05 (×4): 40 mg via SUBCUTANEOUS
  Filled 2013-08-02 (×5): qty 0.4

## 2013-08-02 MED ORDER — ALBUTEROL SULFATE (5 MG/ML) 0.5% IN NEBU
2.5000 mg | INHALATION_SOLUTION | RESPIRATORY_TRACT | Status: AC | PRN
  Administered 2013-08-02: 2.5 mg via RESPIRATORY_TRACT
  Filled 2013-08-02 (×2): qty 0.5

## 2013-08-02 MED ORDER — SODIUM CHLORIDE 0.9 % IV BOLUS (SEPSIS)
1000.0000 mL | Freq: Once | INTRAVENOUS | Status: AC
  Administered 2013-08-02: 1000 mL via INTRAVENOUS

## 2013-08-02 MED ORDER — ADULT MULTIVITAMIN W/MINERALS CH
1.0000 | ORAL_TABLET | Freq: Every day | ORAL | Status: DC
  Administered 2013-08-02 – 2013-08-05 (×4): 1 via ORAL
  Filled 2013-08-02 (×4): qty 1

## 2013-08-02 MED ORDER — SODIUM CHLORIDE 0.9 % IV BOLUS (SEPSIS)
500.0000 mL | Freq: Once | INTRAVENOUS | Status: AC
  Administered 2013-08-02: 500 mL via INTRAVENOUS

## 2013-08-02 MED ORDER — ALBUTEROL (5 MG/ML) CONTINUOUS INHALATION SOLN
10.0000 mg/h | INHALATION_SOLUTION | Freq: Once | RESPIRATORY_TRACT | Status: AC
  Administered 2013-08-02: 10 mg/h via RESPIRATORY_TRACT
  Filled 2013-08-02: qty 20

## 2013-08-02 MED ORDER — LORAZEPAM 2 MG/ML IJ SOLN
1.0000 mg | Freq: Once | INTRAMUSCULAR | Status: AC
  Administered 2013-08-02: 1 mg via INTRAVENOUS
  Filled 2013-08-02: qty 1

## 2013-08-02 MED ORDER — LORAZEPAM 1 MG PO TABS
1.0000 mg | ORAL_TABLET | Freq: Four times a day (QID) | ORAL | Status: DC | PRN
  Administered 2013-08-02 – 2013-08-04 (×9): 1 mg via ORAL
  Filled 2013-08-02 (×9): qty 1

## 2013-08-02 MED ORDER — OXYCODONE HCL 5 MG PO TABS
5.0000 mg | ORAL_TABLET | ORAL | Status: DC | PRN
  Administered 2013-08-02 – 2013-08-05 (×14): 5 mg via ORAL
  Filled 2013-08-02 (×14): qty 1
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
## 2013-08-02 NOTE — ED Notes (Signed)
Pt sts he is starting to feel more anxious.

## 2013-08-02 NOTE — ED Notes (Signed)
Pt states he has been having chest and back pain with SOB since early Friday morning.  Pt given two dup neb tratments by EMS, 125 solumedrol, and pt took a 81mg  ASA prior to coming.
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
## 2013-08-02 NOTE — ED Notes (Signed)
I-STAT TROPONIN WAS  0.08NG.REPORT TO NURSE NATALIE RN. AN PA.TRAN

## 2013-08-02 NOTE — Progress Notes (Signed)
MD paged and aware of D Dimer result. New orders given and activated. Will continue to monitor.

## 2013-08-02 NOTE — Progress Notes (Signed)
MD paged and aware of Pt's increasing CIWA score. New orders given and activated. Will continue to monitor.

## 2013-08-02 NOTE — ED Notes (Signed)
Old and New EKG given to Dr Miller 

## 2013-08-02 NOTE — ED Notes (Signed)
Pt ambulated without oxygen, O2 sats remained at 90-91% on room air. Pt became very SOB and had to return to the room.

## 2013-08-03 DIAGNOSIS — F17200 Nicotine dependence, unspecified, uncomplicated: Secondary | ICD-10-CM

## 2013-08-03 DIAGNOSIS — F1010 Alcohol abuse, uncomplicated: Secondary | ICD-10-CM

## 2013-08-03 DIAGNOSIS — R079 Chest pain, unspecified: Secondary | ICD-10-CM

## 2013-08-03 DIAGNOSIS — M7989 Other specified soft tissue disorders: Secondary | ICD-10-CM

## 2013-08-03 DIAGNOSIS — J189 Pneumonia, unspecified organism: Secondary | ICD-10-CM

## 2013-08-03 LAB — CBC
HCT: 44.7 % (ref 39.0–52.0)
Hemoglobin: 15 g/dL (ref 13.0–17.0)
MCH: 34.7 pg — ABNORMAL HIGH (ref 26.0–34.0)
MCHC: 33.6 g/dL (ref 30.0–36.0)

## 2013-08-03 LAB — COMPREHENSIVE METABOLIC PANEL
ALT: 46 U/L (ref 0–53)
AST: 30 U/L (ref 0–37)
CO2: 25 mEq/L (ref 19–32)
Calcium: 8.7 mg/dL (ref 8.4–10.5)
Potassium: 4.7 mEq/L (ref 3.5–5.1)
Sodium: 135 mEq/L (ref 135–145)
Total Protein: 6.5 g/dL (ref 6.0–8.3)

## 2013-08-03 MED ORDER — AZITHROMYCIN 500 MG PO TABS
500.0000 mg | ORAL_TABLET | Freq: Every day | ORAL | Status: DC
  Administered 2013-08-04 – 2013-08-05 (×2): 500 mg via ORAL
  Filled 2013-08-03 (×2): qty 1

## 2013-08-03 MED ORDER — DM-GUAIFENESIN ER 30-600 MG PO TB12
1.0000 | ORAL_TABLET | Freq: Two times a day (BID) | ORAL | Status: DC
  Administered 2013-08-03 – 2013-08-05 (×4): 1 via ORAL
  Filled 2013-08-03 (×5): qty 1

## 2013-08-03 MED ORDER — PREDNISONE 50 MG PO TABS
60.0000 mg | ORAL_TABLET | Freq: Two times a day (BID) | ORAL | Status: DC
  Administered 2013-08-03 – 2013-08-05 (×4): 60 mg via ORAL
  Filled 2013-08-03 (×6): qty 1

## 2013-08-03 MED ORDER — NICOTINE 21 MG/24HR TD PT24
21.0000 mg | MEDICATED_PATCH | Freq: Every day | TRANSDERMAL | Status: DC
  Administered 2013-08-03 – 2013-08-05 (×3): 21 mg via TRANSDERMAL
  Filled 2013-08-03 (×3): qty 1

## 2013-08-03 NOTE — Progress Notes (Signed)
[facility] PROGRESS NOTE  Ruben Sutton ZOX:[id number] DOB: [birth year] DOA: 08/02/2013 PCP: [name], MD  Assessment/Plan:    COPD exacerbation: still wheezing.  Change to po steroids. Needs to quit smoking   CAP (community acquired pneumonia): continue rocephin.  Change to PO azithro.  Check urine legionella, strep pneumo   Alcohol abuse: no DTs   Tobacco abuse Noncardiac chest pain, PE ruled out.  D/C tele. Due to above  Code Status: full Family Communication: none Disposition Plan: home  Consultants:    Procedures:    Antibiotics:  Rocephin  Azithro  HPI/Subjective: Pain better. Still coughing, wheezing, dyspneic  Objective: Filed Vitals:   08/03/13 1346  BP: 145/89  Pulse: 107  Temp: 98.1 F (36.7 C)  Resp: 20    Intake/Output Summary (Last 24 hours) at 08/03/13 1547 Last data filed at 08/03/13 1230  Gross per 24 hour  Intake    840 ml  Output   2875 ml  Net  -2035 ml   Filed Weights   08/02/13 1054  Weight: 106.9 kg (235 lb 10.8 oz)    Exam:   General:  Alert, oriented, cooperative  Cardiovascular: RRR without MGR  Respiratory:   Bilateral wheeze and rhonchi.   Abdomen: soft, nontender, nondistended  Musculoskeletal: no CCE  Data Reviewed: Basic Metabolic Panel:  Recent Labs Lab 08/02/13 0715 08/02/13 0923 08/03/13 0550  NA 132*  --  135  K 3.4*  --  4.7  CL 93*  --  102  CO2 23  --  25  GLUCOSE 112*  --  162*  BUN 8  --  10  CREATININE 0.80 0.95 0.69  CALCIUM 9.2  --  8.7  MG  --   --  2.2   Liver Function Tests:  Recent Labs Lab 08/02/13 0715 08/03/13 0550  AST 56* 30  ALT 66* 46  ALKPHOS 106 89  BILITOT 0.8 0.3  PROT 7.5 6.5  ALBUMIN 3.2* 2.7*    Recent Labs Lab 08/02/13 0721  LIPASE 24   No results found for this basename: AMMONIA,  in the last 168 hours CBC:  Recent Labs Lab 08/02/13 0715 08/02/13 0923 08/03/13 0550  WBC 10.7* 10.5 15.6*  HGB 17.4* 16.8 15.0  HCT 49.3 49.6 44.7   MCV 102.3* 103.5* 103.5*  PLT 150 164 160   Cardiac Enzymes:  Recent Labs Lab 08/02/13 0936 08/02/13 1637 08/02/13 2200  TROPONINI <0.30 <0.30 <0.30   BNP (last 3 results) No results found for this basename: PROBNP,  in the last 8760 hours CBG: No results found for this basename: GLUCAP,  in the last 168 hours  Recent Results (from the past 240 hour(s))  MRSA PCR SCREENING     Status: None   Collection Time    08/02/13 11:52 AM      Result Value Range Status   MRSA by PCR NEGATIVE  NEGATIVE Final   Comment:            The GeneXpert MRSA Assay (FDA     approved for NASAL specimens     only), is one component of a     comprehensive MRSA colonization     surveillance program. It is not     intended to diagnose MRSA     infection nor to guide or     monitor treatment for     MRSA infections.     Studies: Dg Chest 2 View  08/02/2013   *RADIOLOGY REPORT*  Clinical Data: Short  of breath and chest pain  CHEST - 2 VIEW  Comparison: 03/23/2013  Findings: Heart size and vascularity are normal.  Mild pleural scarring/pleural effusions bilaterally unchanged from prior studies.  Negative for pneumonia or mass lesion.  IMPRESSION: Small bilateral pleural effusions or pleural scarring.  Negative for heart failure or pneumonia.   Original Report Authenticated By: Janeece Riggers, M.D.   Ct Angio Chest Pe W/cm &/or Wo Cm  08/02/2013   *RADIOLOGY REPORT*  Clinical Data: Short of breath  CT ANGIOGRAPHY CHEST  Technique:  Multidetector CT imaging of the chest using the standard protocol during bolus administration of intravenous contrast. Multiplanar reconstructed images including MIPs were obtained and reviewed to evaluate the vascular anatomy.  Contrast: 80mL OMNIPAQUE IOHEXOL 350 MG/ML SOLN  Comparison: 03/07/2013  Findings: Motion artifact degrades the study.  No obvious filling defects in the central pulmonary arterial tree to suggest acute pulmonary thromboembolism.  No abnormal adenopathy.   No pneumothorax.  No pleural effusion.  Patchy pulmonary opacities at the lung bases compatible with atelectasis versus early airspace disease.  No acute bony deformity. Chronic mid thoracic spinous process fractures.  Diffuse hepatic steatosis.  Small gastrohepatic ligament nodes.  IMPRESSION: No evidence of acute pulmonary thromboembolism.  Bibasilar patchy atelectasis verses airspace disease.   Original Report Authenticated By: Jolaine Click, M.D.    Scheduled Meds: . aspirin EC  81 mg Oral Daily  . azithromycin  500 mg Intravenous Q24H  . cefTRIAXone (ROCEPHIN)  IV  1 g Intravenous Q24H  . enoxaparin (LOVENOX) injection  40 mg Subcutaneous Q24H  . folic acid  1 mg Oral Daily  . ipratropium  0.5 mg Nebulization Q6H  . levalbuterol  0.63 mg Nebulization Q6H  . methylPREDNISolone (SOLU-MEDROL) injection  40 mg Intravenous TID  . multivitamin with minerals  1 tablet Oral Daily  . pantoprazole (PROTONIX) IV  40 mg Intravenous Q12H  . sodium chloride  3 mL Intravenous Q12H  . thiamine  100 mg Oral Daily   Or  . thiamine  100 mg Intravenous Daily   Continuous Infusions: . 0.9 % NaCl with KCl 40 mEq / L 75 mL/hr at 08/02/13 1220    Active Problems:   * No active hospital problems. *  Time spent: 35 minutes  Shavonte Zhao L  [facility] Pager [phone]. If 7PM-7AM, please contact night-coverage at [email or web address], password [facility] 08/03/2013, 3:47 PM  LOS: 1 day

## 2013-08-03 NOTE — ED Provider Notes (Signed)
Medical screening examination/treatment/procedure(s) were conducted as a shared visit with non-physician practitioner(s) and myself.  I personally evaluated the patient during the encounter  Please see my separate respective documentation pertaining to this patient encounter   Vida Roller, MD 08/03/13 [phone]

## 2013-08-03 NOTE — Progress Notes (Signed)
VASCULAR LAB PRELIMINARY  PRELIMINARY  PRELIMINARY  PRELIMINARY  Bilateral lower extremity venous duplex completed.    Preliminary report:  Bilateral:  No evidence of DVT, superficial thrombosis, or Baker's Cyst.   Ruben Sutton, RVS 08/03/2013, 10:37 AM

## 2013-08-03 NOTE — Progress Notes (Signed)
Nursing staff received message that pt was flipping between SR and JR. Two EKGs revealed NSR. Pt VSS and asymptomatic. MD on call paged, waiting for call back. Will continue to monitor.    Sabrina Arriaga M

## 2013-08-04 DIAGNOSIS — J209 Acute bronchitis, unspecified: Secondary | ICD-10-CM

## 2013-08-04 DIAGNOSIS — F10239 Alcohol dependence with withdrawal, unspecified: Secondary | ICD-10-CM

## 2013-08-04 LAB — LEGIONELLA ANTIGEN, URINE

## 2013-08-04 MED ORDER — LORAZEPAM 2 MG/ML IJ SOLN
0.0000 mg | INTRAMUSCULAR | Status: DC
  Administered 2013-08-04: 1 mg via INTRAVENOUS
  Administered 2013-08-05: 2 mg via INTRAVENOUS
  Filled 2013-08-04 (×2): qty 1

## 2013-08-04 MED ORDER — LORAZEPAM 2 MG/ML IJ SOLN: 0.0000 mg | Freq: Two times a day (BID) | INTRAMUSCULAR | Status: DC

## 2013-08-04 MED ORDER — LABETALOL HCL 5 MG/ML IV SOLN
10.0000 mg | INTRAVENOUS | Status: DC | PRN
  Filled 2013-08-04: qty 4

## 2013-08-04 MED ORDER — HYDROCHLOROTHIAZIDE 12.5 MG PO CAPS
12.5000 mg | ORAL_CAPSULE | Freq: Every day | ORAL | Status: DC
  Administered 2013-08-04 – 2013-08-05 (×2): 12.5 mg via ORAL
  Filled 2013-08-04 (×2): qty 1

## 2013-08-04 NOTE — Care Management Note (Signed)
    Page 1 of 1   08/05/2013     4:18:04 PM   CARE MANAGEMENT NOTE 08/05/2013  Patient:  Ruben Sutton, Ruben Sutton   Account Number:  [id number]  Date Initiated:  08/04/2013  Documentation initiated by:  Ellias Mcelreath  Subjective/Objective Assessment:   PT ADM ON 08/02/13 WITH COPD EXACERBATION.  PTA, PT INDEPENDENT, LIVES WITH GRANDMOTHER.     Action/Plan:   WILL FOLLOW FOR HOME NEEDS AS PT PROGRESSES.   Anticipated DC Date:  08/05/2013   Anticipated DC Plan:  HOME/SELF CARE      DC Planning Services  CM consult      Choice offered to / List presented to:     DME arranged  NEBULIZER MACHINE      DME agency  [facility]        Status of service:  Completed, signed off Medicare Important Message given?   (If response is "NO", the following Medicare IM given date fields will be blank) Date Medicare IM given:   Date Additional Medicare IM given:    Discharge Disposition:  HOME/SELF CARE  Per UR Regulation:  Reviewed for med. necessity/level of care/duration of stay  If discussed at Long Length of Stay Meetings, dates discussed:    Comments:  08/05/13 Ricka Westra,RN,BSN [phone] REFERRAL TO [facility] FOR DME NEEDS.  PT DOES NOT QUALIFY FOR HOME OXYGEN.   NEB MACHINE DELIVERED TO ROOM PRIOR TO DC.

## 2013-08-04 NOTE — Progress Notes (Signed)
CSW attempted to speak to patient about current alcohol use. Patient sleeping. CSW will attempt to speak to patient this afternoon.  Maree Krabbe, MSW, Theresia Majors [phone]

## 2013-08-04 NOTE — Progress Notes (Signed)
[facility] PROGRESS NOTE  JONAS GOH NWG:[id number] DOB: 19-May-1976 DOA: 08/02/2013 PCP: [name], MD  Assessment/Plan  COPD exacerbation: still wheezing and very Bazil Dhanani of breath with ADLs.  -  Continue po steroids twice a day and frequent nebulizer treatments.  -  Advised to quit smoking  -  Wean oxygen as tolerated -  Ambulate and check pulse oximetry tomorrow morning  CAP (community acquired pneumonia):  Stable -  continue rocephin and PO azithro.  -   urine legionella negative, strep pneumo negative  Alcohol abuse:  CIWA score up to 10 with worsening tachycardia and hypertension -  Changed to every 4 hour CIWA scores and Ativan  Sinus tachycardia, likely due to alcohol withdrawal and nebulizer treatments -  Continue treatment for alcohol withdrawal -  Add when necessary beta blocker  Uncontrolled hypertension, likely due to alcohol withdrawal and possibly steroids -   When necessary labetalol  Tobacco abuse  -  Counseled cessation and continue patch  Noncardiac chest pain, PE ruled out.  Due to above   Diet:  Healthy heart Access:  PIV IVF:  Off Proph:  Lovenox  Code Status: Full Family Communication: Spoke with patient alone Disposition Plan: Home once weaned from oxygen   Consultants:  None  Procedures:  Venous duplex  CTA chest  Antibiotics:  Ceftriaxone from August 10  Azithromycin from August 10   HPI/Subjective:  Patient states he still feels Ruben Sutton of breath with sitting up eating despite wearing oxygen. He has taken his oxygen off to attempt to walk to the bathroom and has to rest to catch his breath. Still feels very wheezy.  Objective: Filed Vitals:   08/04/13 1100 08/04/13 1359 08/04/13 1452 08/04/13 1752  BP: 168/98 150/93  178/100  Pulse: 113 91  103  Temp:  97.8 F (36.6 C)    TempSrc:  Oral    Resp:  18    Height:      Weight:      SpO2:  100% 99% 94%    Intake/Output Summary (Last 24 hours) at 08/04/13  1921 Last data filed at 08/04/13 1600  Gross per 24 hour  Intake    960 ml  Output   1700 ml  Net   -740 ml   Filed Weights   08/02/13 1054  Weight: 106.9 kg (235 lb 10.8 oz)    Exam:   General:  Caucasian male,  mild respiratory distress, with forced expiratory phase, occasional SCM retractions when talking.     HEENT:  NCAT, MMM  Cardiovascular:  Mild tachycardia, regular rhythm, nl S1, S2 no mrg, 2+ pulses, warm extremities  Respiratory:  Diminished at the bilateral bases with full expiratory high-pitched wheeze, no focal rales or rhonchi   Abdomen:   NABS, soft, NT/ND  MSK:   Normal tone and bulk,  trace LEE  Neuro:   Grossly intact  Data Reviewed: Basic Metabolic Panel:  Recent Labs Lab 08/02/13 0715 08/02/13 0923 08/03/13 0550  NA 132*  --  135  K 3.4*  --  4.7  CL 93*  --  102  CO2 23  --  25  GLUCOSE 112*  --  162*  BUN 8  --  10  CREATININE 0.80 0.95 0.69  CALCIUM 9.2  --  8.7  MG  --   --  2.2   Liver Function Tests:  Recent Labs Lab 08/02/13 0715 08/03/13 0550  AST 56* 30  ALT 66* 46  ALKPHOS 106 89  BILITOT  0.8 0.3  PROT 7.5 6.5  ALBUMIN 3.2* 2.7*    Recent Labs Lab 08/02/13 0721  LIPASE 24   No results found for this basename: AMMONIA,  in the last 168 hours CBC:  Recent Labs Lab 08/02/13 0715 08/02/13 0923 08/03/13 0550  WBC 10.7* 10.5 15.6*  HGB 17.4* 16.8 15.0  HCT 49.3 49.6 44.7  MCV 102.3* 103.5* 103.5*  PLT 150 164 160   Cardiac Enzymes:  Recent Labs Lab 08/02/13 0936 08/02/13 1637 08/02/13 2200  TROPONINI <0.30 <0.30 <0.30   BNP (last 3 results) No results found for this basename: PROBNP,  in the last 8760 hours CBG: No results found for this basename: GLUCAP,  in the last 168 hours  Recent Results (from the past 240 hour(s))  MRSA PCR SCREENING     Status: None   Collection Time    08/02/13 11:52 AM      Result Value Range Status   MRSA by PCR NEGATIVE  NEGATIVE Final   Comment:            The  GeneXpert MRSA Assay (FDA     approved for NASAL specimens     only), is one component of a     comprehensive MRSA colonization     surveillance program. It is not     intended to diagnose MRSA     infection nor to guide or     monitor treatment for     MRSA infections.     Studies: No results found.  Scheduled Meds: . aspirin EC  81 mg Oral Daily  . azithromycin  500 mg Oral Daily  . cefTRIAXone (ROCEPHIN)  IV  1 g Intravenous Q24H  . dextromethorphan-guaiFENesin  1 tablet Oral BID  . enoxaparin (LOVENOX) injection  40 mg Subcutaneous Q24H  . folic acid  1 mg Oral Daily  . hydrochlorothiazide  12.5 mg Oral Daily  . ipratropium  0.5 mg Nebulization Q6H  . levalbuterol  0.63 mg Nebulization Q6H  . LORazepam  0-4 mg Intravenous Q4H   Followed by  . [START ON 08/06/2013] LORazepam  0-4 mg Intravenous Q12H  . multivitamin with minerals  1 tablet Oral Daily  . nicotine  21 mg Transdermal Daily  . predniSONE  60 mg Oral BID WC  . sodium chloride  3 mL Intravenous Q12H  . thiamine  100 mg Oral Daily   Continuous Infusions:   Active Problems:   Alcohol abuse   COPD exacerbation   Tobacco abuse   CAP (community acquired pneumonia)    Time spent: 30 min    Razi Hickle, [facility]  [facility] Pager [phone]. If 7PM-7AM, please contact night-coverage at [email or web address], password [facility] 08/04/2013, 7:21 PM  LOS: 2 days

## 2013-08-05 ENCOUNTER — Encounter (HOSPITAL_COMMUNITY): Payer: Self-pay | Admitting: Internal Medicine

## 2013-08-05 MED ORDER — PREDNISONE 50 MG PO TABS: 60.0000 mg | ORAL_TABLET | Freq: Every day | ORAL | Status: DC

## 2013-08-05 MED ORDER — ALBUTEROL SULFATE HFA 108 (90 BASE) MCG/ACT IN AERS: 2.0000 | INHALATION_SPRAY | Freq: Four times a day (QID) | RESPIRATORY_TRACT | Status: DC | PRN

## 2013-08-05 MED ORDER — NICOTINE 21 MG/24HR TD PT24: 1.0000 | MEDICATED_PATCH | Freq: Every day | TRANSDERMAL | Status: DC

## 2013-08-05 MED ORDER — BISOPROLOL-HYDROCHLOROTHIAZIDE 2.5-6.25 MG PO TABS: 2.0000 | ORAL_TABLET | Freq: Every day | ORAL | Status: DC

## 2013-08-05 MED ORDER — LORAZEPAM 1 MG PO TABS: 0.0000 mg | ORAL_TABLET | Freq: Two times a day (BID) | ORAL | Status: DC

## 2013-08-05 MED ORDER — LORAZEPAM 1 MG PO TABS: ORAL_TABLET | ORAL | Status: DC

## 2013-08-05 MED ORDER — LORAZEPAM 1 MG PO TABS
0.0000 mg | ORAL_TABLET | ORAL | Status: DC
  Administered 2013-08-05: 2 mg via ORAL
  Filled 2013-08-05: qty 2

## 2013-08-05 MED ORDER — BECLOMETHASONE DIPROPIONATE 40 MCG/ACT IN AERS: 2.0000 | INHALATION_SPRAY | Freq: Two times a day (BID) | RESPIRATORY_TRACT | Status: DC

## 2013-08-05 MED ORDER — OXYCODONE-ACETAMINOPHEN 5-325 MG PO TABS: 1.0000 | ORAL_TABLET | Freq: Four times a day (QID) | ORAL | Status: DC | PRN

## 2013-08-05 MED ORDER — PREDNISONE 10 MG PO TABS: ORAL_TABLET | ORAL | Status: DC

## 2013-08-05 MED ORDER — ALBUTEROL SULFATE (2.5 MG/3ML) 0.083% IN NEBU: 2.5000 mg | INHALATION_SOLUTION | Freq: Four times a day (QID) | RESPIRATORY_TRACT | Status: DC | PRN

## 2013-08-05 MED ORDER — IPRATROPIUM BROMIDE 0.02 % IN SOLN: 500.0000 ug | Freq: Four times a day (QID) | RESPIRATORY_TRACT | Status: DC

## 2013-08-05 NOTE — Progress Notes (Signed)
2mg  PO Ativan given at this time per pt request; will cont. To monitor.

## 2013-08-05 NOTE — Progress Notes (Signed)
SATURATION QUALIFICATIONS: (This note is used to comply with regulatory documentation for home oxygen)  Patient Saturations on Room Air at Rest = 91%  Patient Saturations on Room Air while Ambulating = 93%  Patient Saturations on  0 Liters of oxygen while Ambulating =   Please briefly explain why patient needs home oxygen: Pt ambulated on RA sat while ambulating 94%; no need to ambulate on O2; pt does not need O2 for d/c home.

## 2013-08-05 NOTE — Progress Notes (Signed)
Per pt request, doesn't want treatment until around 0930. RT will check back at later time.

## 2013-08-05 NOTE — Discharge Summary (Signed)
Physician Discharge Summary  [name] NWG:[id number] DOB: [birth year] DOA: 08/02/2013  PCP: [name], MD  Admit date: 08/02/2013 Discharge date: 08/05/2013  Recommendations for Outpatient Follow-up:  1. Followup with pulmonology within one month of discharge 2.  Follow with primary care doctor within one week of discharge for a respiratory exam.   3. Follow up with cardiology within 2 weeks of discharge for follow up of chest pain in the setting of known CAD.  Please consider initiation of statin and ACEI if patient has demonstrated compliance with his current regimen.    Discharge Diagnoses:  Active Problems:   HTN (hypertension)   Alcohol withdrawal syndrome   Tobacco use disorder   Alcohol abuse   COPD exacerbation   Chest pain   Tobacco abuse   CAP (community acquired pneumonia)   Discharge Condition: Stable, improved  Diet recommendation: Healthy heart  Wt Readings from Last 3 Encounters:  08/02/13 106.9 kg (235 lb 10.8 oz)  03/25/13 105 kg (231 lb 7.7 oz)  03/07/13 107.956 kg (238 lb)    History of present illness:  37 year old male with multiple comorbidities including COPD, alcoholic pancreatitis, hypertension he presents complaining of body pains and shortness of breath. Symptoms started 3 days ago. Describe gradual onset of shortness of breath has been persistent. SOB is gradually worsen. Report productive cough with pleuritic chest pain. Patient endorse abd pain and back pain for the same duration. Abdominal pain felt related to persistent cough. Pain is similar to his chronic pain.. Endorse mild loose stool. Denies fever, chills, headache, hemoptysis, nausea, vomiting, back pain, dysuria, or rash. Patient ran out of his albuterol breathing treatments His last alcohol use was 2 days ago. Primary care doctor is Dr. Lerry Liner.  Hospital Course:   COPD exacerbation: The patient was admitted and started on 4 L nasal cannula and gradually was able to wean  down to 2 L.  He was turned on IV Solu-Medrol and scheduled nebulizer treatments. He was advised to quit smoking and given a nicotine patch. His respiratory distress and oxygen requirement gradually decreased.  He participated in a walking pulse oximetry test on the day of discharge and he did not qualify for oxygen.  He will be set up for home nebulizer treatments.  He was started on home albuterol/ipratropium via nebulizer and Qvar.  He should not plate of full course of antibiotics for community-acquired pneumonia and a steroid taper.  He should followup with his primary care doctor to ensure that he continues to improve despite his tapering steroids within approximately one week of discharge. Once he is improved from this current COPD exacerbation, he should followup with pulmonology for formal PFT.  Community-acquired pneumonia, started on ceftriaxone and azithromycin.   Urine Legionella and strep pneumo antigens were negative.  Alcohol abuse.  He was started on thiamine and folate and CIWA scores. His CIWA scores as high as 10 and he required approximately 4 mg of Ativan per day. His heart rate and his blood pressure were moderately elevated, however this may also have been due to his high dose steroids. He will be given a few additional tabs of Ativan to use as a taper over the next 2 days.  Sinus tachycardia, likely due to alcohol withdrawal and nebulizer treatments.  Patient remained asymptomatic.    Uncontrolled hypertension, likely due to alcohol withdrawal and possibly steroids.  Anticipate that his blood pressure will gradually trended down as his steroids are tapered and his alcohol withdrawal resolves.  He  was started on bisoprolol-HCTZ combination.    CAD s/p CABG.  Presented with chest pain that improved.  ECG stable from prior.  CE negative and ECHO from 4 months ago demonstrated preserved EF.  CTa negative for PE.   He was restarted on beta blocker and ASA 81mg .  Will need evaluation for  statin and ACEI initiation as outpatient.  Recommend follow up with cardiology within 1-2 weeks of discharge to address these issues.     Consultants:  None Procedures:  Venous duplex  CTA chest Antibiotics:  Ceftriaxone from August 10 >> 8/13 Azithromycin (500mg ) from August 10 >> 8/13   Discharge Exam: Filed Vitals:   08/05/13 0613  BP: 172/121  Pulse: 82  Temp: 98.6 F (37 C)  Resp: 18   Filed Vitals:   08/04/13 1752 08/04/13 2028 08/04/13 2103 08/05/13 0613  BP: 178/100 161/100  172/121  Pulse: 103 85 90 82  Temp:  98.2 F (36.8 C)  98.6 F (37 C)  TempSrc:  Oral  Oral  Resp:  18 18 18   Height:      Weight:      SpO2: 94% 93% 96% 96%    General: Caucasian male, no respiratory distress  HEENT: NCAT, MMM  Cardiovascular: Mild tachycardia, regular rhythm, nl S1, S2 no mrg, 2+ pulses, warm extremities  Respiratory:  Diminished at the bilateral bases with full expiratory high-pitched wheeze, no focal rales or rhonchi  Abdomen: NABS, soft, NT/ND  MSK: Normal tone and bulk, trace LEE  Neuro: Grossly intact   Discharge Instructions      Discharge Orders   Future Orders Complete By Expires   Call MD for:  difficulty breathing, headache or visual disturbances  As directed    Call MD for:  extreme fatigue  As directed    Call MD for:  hives  As directed    Call MD for:  persistant dizziness or light-headedness  As directed    Call MD for:  persistant nausea and vomiting  As directed    Call MD for:  severe uncontrolled pain  As directed    Call MD for:  temperature >100.4  As directed    Diet - low sodium heart healthy  As directed    Discharge instructions  As directed    Comments:     You were hospitalized with community-acquired pneumonia and acute COPD exacerbation. You were started on antibiotics, steroids, nebulizer treatments, and your breathing improved. You have completed adequate antibiotics while in the hospital. You will not need to continue these at  home. Please continue prednisone in a tapering dose over the next one to 2 weeks.  We will arrange for nebulizer treatment for your home as well as solutions to using your nebulizer machine. You should use your nebulizer machine at least 3 times per day. I have also given you a prescription for Qvar, which is an inhaled steroid that is used to prevent COPD exacerbations. Please use this every day as prescribed. For alcohol withdrawal I have given you a few additional tabs of Ativan to use on a tapering dose. Please followup with your primary care doctor within one to 2 weeks for repeat examination.  For your chest tightness, you should followup with cardiology within one to 2 weeks. I have given information for the [facility] cardiology group so that you may call and schedule an appointment. I have also included information for the local pulmonology group.  You should followup with them within one month  of discharge for formal testing for asthma and COPD. Please do not smoke cigarettes. Avoid other people who smoke cigarettes.  Talk to your primary care doctor about quitting smoking.   Increase activity slowly  As directed        Medication List    STOP taking these medications       predniSONE 10 MG tablet  Commonly known as:  STERAPRED UNI-PAK  Replaced by:  predniSONE 10 MG tablet      TAKE these medications       albuterol 108 (90 BASE) MCG/ACT inhaler  Commonly known as:  PROVENTIL HFA;VENTOLIN HFA  Inhale 2 puffs into the lungs every 6 (six) hours as needed for wheezing.     albuterol (2.5 MG/3ML) 0.083% nebulizer solution  Commonly known as:  PROVENTIL  Take 3 mL (2.5 mg total) by nebulization every 6 (six) hours as needed for wheezing.     aspirin EC 81 MG tablet  Take 81 mg by mouth daily.     beclomethasone 40 MCG/ACT inhaler  Commonly known as:  QVAR  Inhale 2 puffs into the lungs 2 (two) times daily.     bisoprolol-hydrochlorothiazide 2.5-6.25 MG per tablet  Commonly known  as:  ZIAC  Take 2 tablets by mouth daily.     ipratropium 0.02 % nebulizer solution  Commonly known as:  ATROVENT  Take 2.5 mL (500 mcg total) by nebulization 4 (four) times daily.     LORazepam 1 MG tablet  Commonly known as:  ATIVAN  Take 1 tab three times day x 1 day, 1 tab twice daily x 1 day, 1 tab once daily x 2 days, then stop.     multivitamin with minerals Tabs tablet  Take 1 tablet by mouth daily.     nicotine 21 mg/24hr patch  Commonly known as:  NICODERM CQ - dosed in mg/24 hours  Place 1 patch onto the skin daily.     oxyCODONE-acetaminophen 5-325 MG per tablet  Commonly known as:  PERCOCET/ROXICET  Take 1 tablet by mouth every 6 (six) hours as needed.     predniSONE 10 MG tablet  Commonly known as:  DELTASONE  Take 5 tabs daily x 2 days, 4 tabs daily x 2 days, 3 tabs daily x 2 days, 2 tabs daily x 2 days, 1 tab daily x 2 days, then stop.     VITAMIN C PO  Take 1 tablet by mouth daily.       Follow-up Information   Schedule an appointment as soon as possible for a visit with [name], MD.   Specialty:  Specialist   Contact information:   [phone] [place] [address] [phone]       Follow up with [facility] Main Office [facility]). Schedule an appointment as soon as possible for a visit in 1 week.   Specialty:  Cardiology   Contact information:   [address], [address] [place] [address] [phone]      Follow up with [facility]. Schedule an appointment as soon as possible for a visit in 1 month.   Specialty:  Pulmonology   Contact information:   [address] [place] [address] [phone]      The results of significant diagnostics from this hospitalization (including imaging, microbiology, ancillary and laboratory) are listed below for reference.    Significant Diagnostic Studies: Dg Chest 2 View  08/02/2013   *RADIOLOGY REPORT*  Clinical Data: Kamee Bobst of breath  and chest pain  CHEST -  2 VIEW  Comparison: 03/23/2013  Findings: Heart size and vascularity are normal.  Mild pleural scarring/pleural effusions bilaterally unchanged from prior studies.  Negative for pneumonia or mass lesion.  IMPRESSION: Small bilateral pleural effusions or pleural scarring.  Negative for heart failure or pneumonia.   Original Report Authenticated By: Janeece Riggers, M.D.   Ct Angio Chest Pe W/cm &/or Wo Cm  08/02/2013   *RADIOLOGY REPORT*  Clinical Data: [place] Deleo of breath  CT ANGIOGRAPHY CHEST  Technique:  Multidetector CT imaging of the chest using the standard protocol during bolus administration of intravenous contrast. Multiplanar reconstructed images including MIPs were obtained and reviewed to evaluate the vascular anatomy.  Contrast: 80mL OMNIPAQUE IOHEXOL 350 MG/ML SOLN  Comparison: 03/07/2013  Findings: Motion artifact degrades the study.  No obvious filling defects in the central pulmonary arterial tree to suggest acute pulmonary thromboembolism.  No abnormal adenopathy.  No pneumothorax.  No pleural effusion.  Patchy pulmonary opacities at the lung bases compatible with atelectasis versus early airspace disease.  No acute bony deformity. Chronic mid thoracic spinous process fractures.  Diffuse hepatic steatosis.  Small gastrohepatic ligament nodes.  IMPRESSION: No evidence of acute pulmonary thromboembolism.  Bibasilar patchy atelectasis verses airspace disease.   Original Report Authenticated By: Jolaine Click, M.D.    Microbiology: Recent Results (from the past 240 hour(s))  MRSA PCR SCREENING     Status: None   Collection Time    08/02/13 11:52 AM      Result Value Range Status   MRSA by PCR NEGATIVE  NEGATIVE Final   Comment:            The GeneXpert MRSA Assay (FDA     approved for NASAL specimens     only), is one component of a     comprehensive MRSA colonization     surveillance program. It is not     intended to diagnose MRSA     infection nor to guide or     monitor treatment for      MRSA infections.     Labs: Basic Metabolic Panel:  Recent Labs Lab 08/02/13 0715 08/02/13 0923 08/03/13 0550  NA 132*  --  135  K 3.4*  --  4.7  CL 93*  --  102  CO2 23  --  25  GLUCOSE 112*  --  162*  BUN 8  --  10  CREATININE 0.80 0.95 0.69  CALCIUM 9.2  --  8.7  MG  --   --  2.2   Liver Function Tests:  Recent Labs Lab 08/02/13 0715 08/03/13 0550  AST 56* 30  ALT 66* 46  ALKPHOS 106 89  BILITOT 0.8 0.3  PROT 7.5 6.5  ALBUMIN 3.2* 2.7*    Recent Labs Lab 08/02/13 0721  LIPASE 24   No results found for this basename: AMMONIA,  in the last 168 hours CBC:  Recent Labs Lab 08/02/13 0715 08/02/13 0923 08/03/13 0550  WBC 10.7* 10.5 15.6*  HGB 17.4* 16.8 15.0  HCT 49.3 49.6 44.7  MCV 102.3* 103.5* 103.5*  PLT 150 164 160   Cardiac Enzymes:  Recent Labs Lab 08/02/13 0936 08/02/13 1637 08/02/13 2200  TROPONINI <0.30 <0.30 <0.30   BNP: BNP (last 3 results) No results found for this basename: PROBNP,  in the last 8760 hours CBG: No results found for this basename: GLUCAP,  in the last 168 hours  Time coordinating discharge: 45 minutes  Signed:  Candita Borenstein  [facility] 08/05/2013, 11:00 AM

## 2013-08-05 NOTE — Progress Notes (Signed)
IV and tele monitor d/c at this time; pt given d/c instructions; pt to d/c home with family; will cont. To monitor. 

## 2013-10-14 ENCOUNTER — Emergency Department (HOSPITAL_COMMUNITY): Payer: Medicaid Other

## 2013-10-14 ENCOUNTER — Encounter (HOSPITAL_COMMUNITY): Payer: Self-pay | Admitting: Emergency Medicine

## 2013-10-14 ENCOUNTER — Emergency Department (HOSPITAL_COMMUNITY)
Admission: EM | Admit: 2013-10-14 | Discharge: 2013-10-14 | Disposition: A | Discharge status: Home / Self Care | Payer: Medicaid Other | Attending: Emergency Medicine | Admitting: Emergency Medicine

## 2013-10-14 DIAGNOSIS — Y939 Activity, unspecified: Secondary | ICD-10-CM | POA: Insufficient documentation

## 2013-10-14 DIAGNOSIS — Z87828 Personal history of other (healed) physical injury and trauma: Secondary | ICD-10-CM | POA: Insufficient documentation

## 2013-10-14 DIAGNOSIS — Z8701 Personal history of pneumonia (recurrent): Secondary | ICD-10-CM | POA: Insufficient documentation

## 2013-10-14 DIAGNOSIS — Z8659 Personal history of other mental and behavioral disorders: Secondary | ICD-10-CM | POA: Insufficient documentation

## 2013-10-14 DIAGNOSIS — I2510 Atherosclerotic heart disease of native coronary artery without angina pectoris: Secondary | ICD-10-CM | POA: Insufficient documentation

## 2013-10-14 DIAGNOSIS — S79919A Unspecified injury of unspecified hip, initial encounter: Secondary | ICD-10-CM | POA: Insufficient documentation

## 2013-10-14 DIAGNOSIS — F17200 Nicotine dependence, unspecified, uncomplicated: Secondary | ICD-10-CM | POA: Insufficient documentation

## 2013-10-14 DIAGNOSIS — I10 Essential (primary) hypertension: Secondary | ICD-10-CM | POA: Insufficient documentation

## 2013-10-14 DIAGNOSIS — Z951 Presence of aortocoronary bypass graft: Secondary | ICD-10-CM | POA: Insufficient documentation

## 2013-10-14 DIAGNOSIS — Z8719 Personal history of other diseases of the digestive system: Secondary | ICD-10-CM | POA: Insufficient documentation

## 2013-10-14 DIAGNOSIS — M25551 Pain in right hip: Secondary | ICD-10-CM

## 2013-10-14 DIAGNOSIS — Y92410 Unspecified street and highway as the place of occurrence of the external cause: Secondary | ICD-10-CM | POA: Insufficient documentation

## 2013-10-14 DIAGNOSIS — M25552 Pain in left hip: Secondary | ICD-10-CM

## 2013-10-14 DIAGNOSIS — J441 Chronic obstructive pulmonary disease with (acute) exacerbation: Secondary | ICD-10-CM | POA: Insufficient documentation

## 2013-10-14 DIAGNOSIS — Z79899 Other long term (current) drug therapy: Secondary | ICD-10-CM | POA: Insufficient documentation

## 2013-10-14 MED ORDER — ALBUTEROL SULFATE (2.5 MG/3ML) 0.083% IN NEBU: 2.5000 mg | INHALATION_SOLUTION | Freq: Four times a day (QID) | RESPIRATORY_TRACT | Status: DC | PRN

## 2013-10-14 MED ORDER — IPRATROPIUM BROMIDE 0.02 % IN SOLN: 500.0000 ug | Freq: Four times a day (QID) | RESPIRATORY_TRACT | Status: DC

## 2013-10-14 MED ORDER — METHOCARBAMOL 500 MG PO TABS: 750.0000 mg | ORAL_TABLET | Freq: Two times a day (BID) | ORAL | Status: DC

## 2013-10-14 MED ORDER — OXYCODONE-ACETAMINOPHEN 5-325 MG PO TABS: 2.0000 | ORAL_TABLET | ORAL | Status: DC | PRN

## 2013-10-14 MED ORDER — HYDROCODONE-ACETAMINOPHEN 5-325 MG PO TABS
2.0000 | ORAL_TABLET | Freq: Once | ORAL | Status: AC
  Administered 2013-10-14: 2 via ORAL
  Filled 2013-10-14: qty 2

## 2013-10-14 MED ORDER — IBUPROFEN 800 MG PO TABS: 800.0000 mg | ORAL_TABLET | Freq: Three times a day (TID) | ORAL | Status: DC
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
## 2013-10-14 NOTE — ED Notes (Signed)
C/O bilateral hip pain x 1 week. No known injury.

## 2013-10-15 NOTE — ED Provider Notes (Signed)
Medical screening examination/treatment/procedure(s) were performed by non-physician practitioner and as supervising physician I was immediately available for consultation/collaboration.    Junius Argyle, MD 10/15/13 1257

## 2014-03-04 ENCOUNTER — Emergency Department (HOSPITAL_COMMUNITY)
Admission: EM | Admit: 2014-03-04 | Discharge: 2014-03-04 | Disposition: A | Discharge status: Home / Self Care | Payer: Medicaid Other | Attending: Emergency Medicine | Admitting: Emergency Medicine

## 2014-03-04 ENCOUNTER — Encounter (HOSPITAL_COMMUNITY): Payer: Self-pay | Admitting: Emergency Medicine

## 2014-03-04 DIAGNOSIS — I2510 Atherosclerotic heart disease of native coronary artery without angina pectoris: Secondary | ICD-10-CM | POA: Insufficient documentation

## 2014-03-04 DIAGNOSIS — F17200 Nicotine dependence, unspecified, uncomplicated: Secondary | ICD-10-CM | POA: Insufficient documentation

## 2014-03-04 DIAGNOSIS — R4789 Other speech disturbances: Secondary | ICD-10-CM | POA: Insufficient documentation

## 2014-03-04 DIAGNOSIS — J449 Chronic obstructive pulmonary disease, unspecified: Secondary | ICD-10-CM | POA: Insufficient documentation

## 2014-03-04 DIAGNOSIS — IMO0002 Reserved for concepts with insufficient information to code with codable children: Secondary | ICD-10-CM | POA: Insufficient documentation

## 2014-03-04 DIAGNOSIS — Z8719 Personal history of other diseases of the digestive system: Secondary | ICD-10-CM | POA: Insufficient documentation

## 2014-03-04 DIAGNOSIS — M25559 Pain in unspecified hip: Secondary | ICD-10-CM | POA: Insufficient documentation

## 2014-03-04 DIAGNOSIS — I10 Essential (primary) hypertension: Secondary | ICD-10-CM | POA: Insufficient documentation

## 2014-03-04 DIAGNOSIS — F911 Conduct disorder, childhood-onset type: Secondary | ICD-10-CM | POA: Insufficient documentation

## 2014-03-04 DIAGNOSIS — Z8701 Personal history of pneumonia (recurrent): Secondary | ICD-10-CM | POA: Insufficient documentation

## 2014-03-04 DIAGNOSIS — Z79899 Other long term (current) drug therapy: Secondary | ICD-10-CM | POA: Insufficient documentation

## 2014-03-04 DIAGNOSIS — G8929 Other chronic pain: Secondary | ICD-10-CM | POA: Insufficient documentation

## 2014-03-04 DIAGNOSIS — Z951 Presence of aortocoronary bypass graft: Secondary | ICD-10-CM | POA: Insufficient documentation

## 2014-03-04 DIAGNOSIS — J4489 Other specified chronic obstructive pulmonary disease: Secondary | ICD-10-CM | POA: Insufficient documentation

## 2014-03-04 MED ORDER — CYCLOBENZAPRINE HCL 10 MG PO TABS: 10.0000 mg | ORAL_TABLET | Freq: Once | ORAL | Status: DC

## 2014-03-04 MED ORDER — METHOCARBAMOL 500 MG PO TABS: 500.0000 mg | ORAL_TABLET | Freq: Two times a day (BID) | ORAL | Status: DC

## 2014-03-04 MED ORDER — ACETAMINOPHEN 325 MG PO TABS: 650.0000 mg | ORAL_TABLET | Freq: Once | ORAL | Status: DC

## 2014-03-04 NOTE — ED Notes (Signed)
Reports hx of hip pain, was suppose to get an MRI done but did not follow through. Pt having increase in pain since yesterday, difficulty ambulating. Denies any incontinence of urine or bowel.

## 2014-03-04 NOTE — Discharge Instructions (Signed)
PLEASE CALL DR. Luiz Blare' OFFICE TO SET UP YOUR MRI. PLEASE TAKE ROBAXIN AS PRESCRIBED. Please use your crutches to help your hip pain. Please read all discharge instructions and return precautions.   Hip Pain The hips join the upper legs to the lower pelvis. The bones, cartilage, tendons, and muscles of the hip joint perform a lot of work each day holding your body weight and allowing you to move around. Hip pain is a common symptom. It can range from a minor ache to severe pain on 1 or both hips. Pain may be felt on the inside of the hip joint near the groin, or the outside near the buttocks and upper thigh. There may be swelling or stiffness as well. It occurs more often when a person walks or performs activity. There are many reasons hip pain can develop. CAUSES  It is important to work with your caregiver to identify the cause since many conditions can impact the bones, cartilage, muscles, and tendons of the hips. Causes for hip pain include:  Broken (fractured) bones.  Separation of the thighbone from the hip socket (dislocation).  Torn cartilage of the hip joint.  Swelling (inflammation) of a tendon (tendonitis), the sac within the hip joint (bursitis), or a joint.  A weakening in the abdominal wall (hernia), affecting the nerves to the hip.  Arthritis in the hip joint or lining of the hip joint.  Pinched nerves in the back, hip, or upper thigh.  A bulging disc in the spine (herniated disc).  Rarely, bone infection or cancer. DIAGNOSIS  The location of your hip pain will help your caregiver understand what may be causing the pain. A diagnosis is based on your medical history, your symptoms, results from your physical exam, and results from diagnostic tests. Diagnostic tests may include X-ray exams, a computerized magnetic scan (magnetic resonance imaging, MRI), or bone scan. TREATMENT  Treatment will depend on the cause of your hip pain. Treatment may include:  Limiting activities  and resting until symptoms improve.  Crutches or other walking supports (a cane or brace).  Ice, elevation, and compression.  Physical therapy or home exercises.  Shoe inserts or special shoes.  Losing weight.  Medications to reduce pain.  Undergoing surgery. HOME CARE INSTRUCTIONS   Only take over-the-counter or prescription medicines for pain, discomfort, or fever as directed by your caregiver.  Put ice on the injured area:  Put ice in a plastic bag.  Place a towel between your skin and the bag.  Leave the ice on for 15-20 minutes at a time, 03-04 times a day.  Keep your leg raised (elevated) when possible to lessen swelling.  Avoid activities that cause pain.  Follow specific exercises as directed by your caregiver.  Sleep with a pillow between your legs on your most comfortable side.  Record how often you have hip pain, the location of the pain, and what it feels like. This information may be helpful to you and your caregiver.  Ask your caregiver about returning to work or sports and whether you should drive.  Follow up with your caregiver for further exams, therapy, or testing as directed. SEEK MEDICAL CARE IF:   Your pain or swelling continues or worsens after 1 week.  You are feeling unwell or have chills.  You have increasing difficulty with walking.  You have a loss of sensation or other new symptoms.  You have questions or concerns. SEEK IMMEDIATE MEDICAL CARE IF:   You cannot put weight on  the affected hip.  You have fallen.  You have a sudden increase in pain and swelling in your hip.  You have a fever. MAKE SURE YOU:   Understand these instructions.  Will watch your condition.  Will get help right away if you are not doing well or get worse. Document Released: 05/30/2010 Document Revised: 03/03/2012 Document Reviewed: 05/30/2010 [facility] Patient Information 2014 [employer] [place].

## 2014-03-04 NOTE — ED Notes (Signed)
Pt states her did not follow up with Dr. Luiz BlareGraves "because of the holidays". States he wants an MRI today. Pt smells of ETOH. When questioned about hx "Can't you just look in my chart".  "Ruben Sutton got to do something for me right now". Pt assisted to the stretcher.

## 2014-03-04 NOTE — ED Notes (Signed)
Pt refused Tylenol and crutches. Upset about not getting pain meds. Asked to talk with PA again. PA went in again to confirm that he was not getting a narcotic. Pt discharged per wheelchair.
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
## 2014-03-04 NOTE — ED Provider Notes (Signed)
Medical screening examination/treatment/procedure(s) were performed by non-physician practitioner and as supervising physician I was immediately available for consultation/collaboration.   EKG Interpretation None        Alexsa Flaum N Christoher Drudge, DO 03/04/14 1636 

## 2014-03-04 NOTE — Progress Notes (Signed)
Orthopedic Tech Progress Note Patient Details:  Ruben Sutton [birth year][name][birth year] [id number]  Ortho Devices Type of Ortho Device: Crutches Ortho Device/Splint Interventions: Ruben BottomOrdered   Ruben Sutton, Ruben Sutton 03/04/2014, 1:43 PM

## 2014-04-27 ENCOUNTER — Encounter (HOSPITAL_COMMUNITY): Payer: Self-pay | Admitting: Pharmacy Technician

## 2014-04-28 ENCOUNTER — Inpatient Hospital Stay (HOSPITAL_COMMUNITY): Admission: RE | Admit: 2014-04-28 | Payer: Medicaid Other | Source: Ambulatory Visit

## 2014-04-29 ENCOUNTER — Encounter (HOSPITAL_COMMUNITY)
Admission: RE | Admit: 2014-04-29 | Discharge: 2014-04-29 | Disposition: A | Discharge status: Home / Self Care | Payer: Medicaid Other | Source: Ambulatory Visit | Attending: Anesthesiology | Admitting: Anesthesiology

## 2014-04-29 ENCOUNTER — Other Ambulatory Visit: Payer: Self-pay | Admitting: Orthopedic Surgery

## 2014-04-29 ENCOUNTER — Encounter (HOSPITAL_COMMUNITY): Payer: Self-pay

## 2014-04-29 ENCOUNTER — Encounter (HOSPITAL_COMMUNITY)
Admission: RE | Admit: 2014-04-29 | Discharge: 2014-04-29 | Disposition: A | Discharge status: Home / Self Care | Payer: Medicaid Other | Source: Ambulatory Visit | Attending: Orthopedic Surgery | Admitting: Orthopedic Surgery

## 2014-04-29 DIAGNOSIS — I10 Essential (primary) hypertension: Secondary | ICD-10-CM | POA: Insufficient documentation

## 2014-04-29 DIAGNOSIS — Z01810 Encounter for preprocedural cardiovascular examination: Secondary | ICD-10-CM | POA: Insufficient documentation

## 2014-04-29 DIAGNOSIS — Z01812 Encounter for preprocedural laboratory examination: Secondary | ICD-10-CM | POA: Insufficient documentation

## 2014-04-29 DIAGNOSIS — Z01818 Encounter for other preprocedural examination: Secondary | ICD-10-CM | POA: Insufficient documentation

## 2014-04-29 HISTORY — DX: Accidental discharge from unspecified firearms or gun, initial encounter: W34.00XA

## 2014-04-29 HISTORY — DX: Puncture wound without foreign body of unspecified front wall of thorax with penetration into thoracic cavity, initial encounter: S21.339A

## 2014-04-29 LAB — CBC
HCT: 49.2 % (ref 39.0–52.0)
HEMOGLOBIN: 16.6 g/dL (ref 13.0–17.0)
MCH: 33.9 pg (ref 26.0–34.0)
MCHC: 33.7 g/dL (ref 30.0–36.0)
MCV: 100.4 fL — ABNORMAL HIGH (ref 78.0–100.0)
PLATELETS: 111 10*3/uL — AB (ref 150–400)
RBC: 4.9 MIL/uL (ref 4.22–5.81)
RDW: 14 % (ref 11.5–15.5)
WBC: 5 10*3/uL (ref 4.0–10.5)

## 2014-04-29 LAB — SURGICAL PCR SCREEN
MRSA, PCR: NEGATIVE
Staphylococcus aureus: NEGATIVE

## 2014-04-29 LAB — TYPE AND SCREEN
ABO/RH(D): O POS
ANTIBODY SCREEN: NEGATIVE

## 2014-04-29 LAB — COMPREHENSIVE METABOLIC PANEL
ALT: 78 U/L — ABNORMAL HIGH (ref 0–53)
AST: 126 U/L — ABNORMAL HIGH (ref 0–37)
Albumin: 2.4 g/dL — ABNORMAL LOW (ref 3.5–5.2)
Alkaline Phosphatase: 138 U/L — ABNORMAL HIGH (ref 39–117)
BILIRUBIN TOTAL: 0.3 mg/dL (ref 0.3–1.2)
BUN: 4 mg/dL — AB (ref 6–23)
CALCIUM: 8.1 mg/dL — AB (ref 8.4–10.5)
CHLORIDE: 100 meq/L (ref 96–112)
CO2: 25 meq/L (ref 19–32)
CREATININE: 0.62 mg/dL (ref 0.50–1.35)
GLUCOSE: 110 mg/dL — AB (ref 70–99)
Potassium: 4.2 mEq/L (ref 3.7–5.3)
Sodium: 140 mEq/L (ref 137–147)
Total Protein: 7.5 g/dL (ref 6.0–8.3)

## 2014-04-29 LAB — DIFFERENTIAL
BASOS ABS: 0 10*3/uL (ref 0.0–0.1)
BASOS PCT: 0 % (ref 0–1)
Eosinophils Absolute: 0.1 10*3/uL (ref 0.0–0.7)
Eosinophils Relative: 1 % (ref 0–5)
Lymphocytes Relative: 25 % (ref 12–46)
Lymphs Abs: 1.1 10*3/uL (ref 0.7–4.0)
MONOS PCT: 10 % (ref 3–12)
Monocytes Absolute: 0.4 10*3/uL (ref 0.1–1.0)
NEUTROS ABS: 2.8 10*3/uL (ref 1.7–7.7)
NEUTROS PCT: 64 % (ref 43–77)

## 2014-04-29 LAB — PROTIME-INR
INR: 0.99 (ref 0.00–1.49)
PROTHROMBIN TIME: 12.9 s (ref 11.6–15.2)

## 2014-04-29 LAB — ABO/RH: ABO/RH(D): O POS

## 2014-04-29 LAB — APTT: aPTT: 29 seconds (ref 24–37)
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
## 2014-04-29 NOTE — Pre-Procedure Instructions (Signed)
Ruben Sutton  04/29/2014   Your procedure is scheduled on:  Friday May 15 th at 1235 PM  [facility] at 1035 AM.  Call this number if you have problems the morning of surgery: [phone]   Remember:   Do not eat food or drink liquids after midnight.   Take these medicines the morning of surgery with A SIP OF WATER: None   Do not wear jewelry.  Do not wear lotions, powders, or colognes. You may wear deodorant.   Men may shave face and neck.  Do not bring valuables to the hospital.  [facility] not [facility] for any belongings or valuables.               Contacts, dentures or bridgework may not be worn into surgery.  Leave suitcase in the car. After surgery it may be brought to your room.  For patients admitted to the hospital, discharge time is determined by your treatment team.               Patients discharged the day of surgery will not be allowed to drive home.    Special Instructions: [facility] - Preparing for Surgery  Before surgery, you can play an important role.  Because skin is not sterile, your skin needs to be as free of germs as possible.  You can reduce the number of germs on you skin by washing with CHG (chlorahexidine gluconate) soap before surgery.  CHG is an antiseptic cleaner which kills germs and bonds with the skin to continue killing germs even after washing.  Please DO NOT use if you have an allergy to CHG or antibacterial soaps.  If your skin becomes reddened/irritated stop using the CHG and inform your nurse when you arrive at Short Stay.  Do not shave (including legs and underarms) for at least 48 hours prior to the first CHG shower.  You may shave your face.  Please follow these instructions carefully:   1.  Shower with CHG Soap the night before surgery and the                                morning of Surgery.  2.  If you choose to wash your hair, wash your hair first as usual with your       normal shampoo.  3.  After  you shampoo, rinse your hair and body thoroughly to remove the                      Shampoo.  4.  Use CHG as you would any other liquid soap.  You can apply chg directly       to the skin and wash gently with scrungie or a clean washcloth.  5.  Apply the CHG Soap to your body ONLY FROM THE NECK DOWN.        Do not use on open wounds or open sores.  Avoid contact with your eyes,       ears, mouth and genitals (private parts).  Wash genitals (private parts)       with your normal soap.  6.  Wash thoroughly, paying special attention to the area where your surgery        will be performed.  7.  Thoroughly rinse your body with warm water from the neck down.  8.  DO NOT shower/wash with  your normal soap after using and rinsing off       the CHG Soap.  9.  Pat yourself dry with a clean towel.            10.  Wear clean pajamas.            11.  Place clean sheets on your bed the night of your first shower and do not        sleep with pets.  Day of Surgery  Do not apply any lotions/deoderants the morning of surgery.  Please wear clean clothes to the hospital/surgery center.     Please read over the following fact sheets that you were given: Pain Booklet, Coughing and Deep Breathing, Blood Transfusion Information, MRSA Information and Surgical Site Infection Prevention

## 2014-04-29 NOTE — Progress Notes (Signed)
[facility] and any cardiac studies and last OV notes .

## 2014-04-30 ENCOUNTER — Encounter (HOSPITAL_COMMUNITY): Payer: Self-pay

## 2014-05-04 MED ORDER — CHLORHEXIDINE GLUCONATE 4 % EX LIQD
60.0000 mL | Freq: Once | CUTANEOUS | Status: DC
  Filled 2014-05-04: qty 60

## 2014-05-04 MED ORDER — DEXTROSE 5 % IV SOLN
3.0000 g | INTRAVENOUS | Status: AC
  Administered 2014-05-05: 3 g via INTRAVENOUS
  Filled 2014-05-04: qty 3000

## 2014-05-05 ENCOUNTER — Inpatient Hospital Stay (HOSPITAL_COMMUNITY): Payer: Medicaid Other

## 2014-05-05 ENCOUNTER — Inpatient Hospital Stay (HOSPITAL_COMMUNITY): Payer: Medicaid Other | Admitting: Certified Registered"

## 2014-05-05 ENCOUNTER — Encounter (HOSPITAL_COMMUNITY): Payer: Self-pay | Admitting: *Deleted

## 2014-05-05 ENCOUNTER — Encounter (HOSPITAL_COMMUNITY): Payer: Medicaid Other | Admitting: Vascular Surgery

## 2014-05-05 ENCOUNTER — Encounter (HOSPITAL_COMMUNITY)
Admission: RE | Disposition: A | Discharge status: Home Health | Payer: Self-pay | Source: Ambulatory Visit | Attending: Orthopedic Surgery

## 2014-05-05 ENCOUNTER — Inpatient Hospital Stay (HOSPITAL_COMMUNITY)
Admission: RE | Admit: 2014-05-05 | Discharge: 2014-05-07 | DRG: 470 | Disposition: A | Discharge status: Home Health | Payer: Medicaid Other | Source: Ambulatory Visit | Attending: Orthopedic Surgery | Admitting: Orthopedic Surgery

## 2014-05-05 DIAGNOSIS — F411 Generalized anxiety disorder: Secondary | ICD-10-CM | POA: Diagnosis present

## 2014-05-05 DIAGNOSIS — F3289 Other specified depressive episodes: Secondary | ICD-10-CM | POA: Diagnosis present

## 2014-05-05 DIAGNOSIS — K219 Gastro-esophageal reflux disease without esophagitis: Secondary | ICD-10-CM | POA: Diagnosis present

## 2014-05-05 DIAGNOSIS — S68118A Complete traumatic metacarpophalangeal amputation of other finger, initial encounter: Secondary | ICD-10-CM

## 2014-05-05 DIAGNOSIS — I10 Essential (primary) hypertension: Secondary | ICD-10-CM | POA: Diagnosis present

## 2014-05-05 DIAGNOSIS — J449 Chronic obstructive pulmonary disease, unspecified: Secondary | ICD-10-CM | POA: Diagnosis present

## 2014-05-05 DIAGNOSIS — F1010 Alcohol abuse, uncomplicated: Secondary | ICD-10-CM | POA: Diagnosis present

## 2014-05-05 DIAGNOSIS — M1610 Unilateral primary osteoarthritis, unspecified hip: Secondary | ICD-10-CM | POA: Diagnosis present

## 2014-05-05 DIAGNOSIS — Z79899 Other long term (current) drug therapy: Secondary | ICD-10-CM

## 2014-05-05 DIAGNOSIS — R062 Wheezing: Secondary | ICD-10-CM | POA: Diagnosis not present

## 2014-05-05 DIAGNOSIS — F1020 Alcohol dependence, uncomplicated: Secondary | ICD-10-CM | POA: Diagnosis present

## 2014-05-05 DIAGNOSIS — J4489 Other specified chronic obstructive pulmonary disease: Secondary | ICD-10-CM | POA: Diagnosis present

## 2014-05-05 DIAGNOSIS — F329 Major depressive disorder, single episode, unspecified: Secondary | ICD-10-CM | POA: Diagnosis present

## 2014-05-05 DIAGNOSIS — M169 Osteoarthritis of hip, unspecified: Secondary | ICD-10-CM | POA: Diagnosis present

## 2014-05-05 DIAGNOSIS — M87059 Idiopathic aseptic necrosis of unspecified femur: Principal | ICD-10-CM | POA: Diagnosis present

## 2014-05-05 DIAGNOSIS — F17200 Nicotine dependence, unspecified, uncomplicated: Secondary | ICD-10-CM | POA: Diagnosis present

## 2014-05-05 DIAGNOSIS — M87051 Idiopathic aseptic necrosis of right femur: Secondary | ICD-10-CM | POA: Diagnosis present

## 2014-05-05 DIAGNOSIS — Z7982 Long term (current) use of aspirin: Secondary | ICD-10-CM

## 2014-05-05 DIAGNOSIS — Z8249 Family history of ischemic heart disease and other diseases of the circulatory system: Secondary | ICD-10-CM

## 2014-05-05 HISTORY — PX: TOTAL HIP ARTHROPLASTY: SHX124

## 2014-05-05 SURGERY — ARTHROPLASTY, HIP, TOTAL, ANTERIOR APPROACH
Anesthesia: General | Site: Hip | Laterality: Right

## 2014-05-05 MED ORDER — DEXAMETHASONE SODIUM PHOSPHATE 10 MG/ML IJ SOLN
10.0000 mg | Freq: Three times a day (TID) | INTRAMUSCULAR | Status: AC
  Administered 2014-05-05 – 2014-05-06 (×2): 10 mg via INTRAVENOUS
  Filled 2014-05-05 (×3): qty 1

## 2014-05-05 MED ORDER — ROCURONIUM BROMIDE 100 MG/10ML IV SOLN
INTRAVENOUS | Status: DC | PRN
  Administered 2014-05-05 (×2): 50 mg via INTRAVENOUS

## 2014-05-05 MED ORDER — SODIUM CHLORIDE 0.9 % IV SOLN
INTRAVENOUS | Status: DC
  Administered 2014-05-05 – 2014-05-06 (×2): via INTRAVENOUS

## 2014-05-05 MED ORDER — PROPOFOL 10 MG/ML IV BOLUS
INTRAVENOUS | Status: AC
  Filled 2014-05-05: qty 20

## 2014-05-05 MED ORDER — MIDAZOLAM HCL 2 MG/2ML IJ SOLN
INTRAMUSCULAR | Status: AC
  Filled 2014-05-05: qty 2

## 2014-05-05 MED ORDER — NEOSTIGMINE METHYLSULFATE 10 MG/10ML IV SOLN
INTRAVENOUS | Status: AC
  Filled 2014-05-05: qty 1

## 2014-05-05 MED ORDER — FENTANYL CITRATE 0.05 MG/ML IJ SOLN
INTRAMUSCULAR | Status: AC
  Filled 2014-05-05: qty 5

## 2014-05-05 MED ORDER — FENTANYL CITRATE 0.05 MG/ML IJ SOLN
INTRAMUSCULAR | Status: DC | PRN
  Administered 2014-05-05 (×3): 50 ug via INTRAVENOUS
  Administered 2014-05-05: 100 ug via INTRAVENOUS
  Administered 2014-05-05: 50 ug via INTRAVENOUS
  Administered 2014-05-05: 100 ug via INTRAVENOUS
  Administered 2014-05-05: 50 ug via INTRAVENOUS
  Administered 2014-05-05: 100 ug via INTRAVENOUS
  Administered 2014-05-05: 50 ug via INTRAVENOUS
  Administered 2014-05-05: 100 ug via INTRAVENOUS

## 2014-05-05 MED ORDER — SPIRITUS FRUMENTI
1.0000 | Freq: Every day | ORAL | Status: DC
  Administered 2014-05-06: 1 via ORAL
  Filled 2014-05-05 (×4): qty 1

## 2014-05-05 MED ORDER — DEXAMETHASONE 6 MG PO TABS
10.0000 mg | ORAL_TABLET | Freq: Three times a day (TID) | ORAL | Status: AC
  Administered 2014-05-06: 10 mg via ORAL
  Filled 2014-05-05 (×3): qty 1

## 2014-05-05 MED ORDER — ONDANSETRON HCL 4 MG/2ML IJ SOLN: 4.0000 mg | Freq: Four times a day (QID) | INTRAMUSCULAR | Status: DC | PRN

## 2014-05-05 MED ORDER — VECURONIUM BROMIDE 10 MG IV SOLR
INTRAVENOUS | Status: AC
  Filled 2014-05-05: qty 10

## 2014-05-05 MED ORDER — PROMETHAZINE HCL 25 MG/ML IJ SOLN: 12.5000 mg | Freq: Four times a day (QID) | INTRAMUSCULAR | Status: DC | PRN

## 2014-05-05 MED ORDER — ESMOLOL HCL 10 MG/ML IV SOLN
INTRAVENOUS | Status: DC | PRN
  Administered 2014-05-05: 20 mg via INTRAVENOUS

## 2014-05-05 MED ORDER — METHOCARBAMOL 750 MG PO TABS: 750.0000 mg | ORAL_TABLET | Freq: Three times a day (TID) | ORAL | Status: DC | PRN

## 2014-05-05 MED ORDER — ASPIRIN EC 325 MG PO TBEC
325.0000 mg | DELAYED_RELEASE_TABLET | Freq: Two times a day (BID) | ORAL | Status: DC
  Administered 2014-05-06 – 2014-05-07 (×3): 325 mg via ORAL
  Filled 2014-05-05 (×5): qty 1

## 2014-05-05 MED ORDER — ALBUTEROL SULFATE HFA 108 (90 BASE) MCG/ACT IN AERS
INHALATION_SPRAY | RESPIRATORY_TRACT | Status: DC | PRN
  Administered 2014-05-05 (×2): 2 via RESPIRATORY_TRACT

## 2014-05-05 MED ORDER — VITAMIN B-1 100 MG PO TABS
100.0000 mg | ORAL_TABLET | Freq: Every day | ORAL | Status: DC
  Administered 2014-05-06 – 2014-05-07 (×2): 100 mg via ORAL
  Filled 2014-05-05 (×2): qty 1

## 2014-05-05 MED ORDER — POLYETHYLENE GLYCOL 3350 17 G PO PACK: 17.0000 g | PACK | Freq: Every day | ORAL | Status: DC | PRN

## 2014-05-05 MED ORDER — CEFAZOLIN SODIUM-DEXTROSE 2-3 GM-% IV SOLR
2.0000 g | Freq: Four times a day (QID) | INTRAVENOUS | Status: AC
  Administered 2014-05-05 – 2014-05-06 (×2): 2 g via INTRAVENOUS
  Filled 2014-05-05 (×2): qty 50

## 2014-05-05 MED ORDER — ASPIRIN EC 325 MG PO TBEC: 325.0000 mg | DELAYED_RELEASE_TABLET | Freq: Two times a day (BID) | ORAL | Status: DC

## 2014-05-05 MED ORDER — DEXAMETHASONE SODIUM PHOSPHATE 10 MG/ML IJ SOLN
INTRAMUSCULAR | Status: DC | PRN
  Administered 2014-05-05: 10 mg via INTRAVENOUS

## 2014-05-05 MED ORDER — MIDAZOLAM HCL 5 MG/5ML IJ SOLN
INTRAMUSCULAR | Status: DC | PRN
  Administered 2014-05-05 (×2): 2 mg via INTRAVENOUS

## 2014-05-05 MED ORDER — GLYCOPYRROLATE 0.2 MG/ML IJ SOLN
INTRAMUSCULAR | Status: DC | PRN
  Administered 2014-05-05: 0.6 mg via INTRAVENOUS
  Administered 2014-05-05: 0.2 mg via INTRAVENOUS

## 2014-05-05 MED ORDER — 0.9 % SODIUM CHLORIDE (POUR BTL) OPTIME
TOPICAL | Status: DC | PRN
  Administered 2014-05-05: 1000 mL

## 2014-05-05 MED ORDER — OXYCODONE HCL 5 MG PO TABS
5.0000 mg | ORAL_TABLET | ORAL | Status: DC | PRN
  Administered 2014-05-06 – 2014-05-07 (×8): 10 mg via ORAL
  Filled 2014-05-05 (×8): qty 2

## 2014-05-05 MED ORDER — LORAZEPAM 2 MG/ML IJ SOLN
1.0000 mg | Freq: Four times a day (QID) | INTRAMUSCULAR | Status: DC | PRN
  Administered 2014-05-05 – 2014-05-06 (×2): 1 mg via INTRAVENOUS
  Filled 2014-05-05: qty 1

## 2014-05-05 MED ORDER — PHENOL 1.4 % MT LIQD: 1.0000 | OROMUCOSAL | Status: DC | PRN

## 2014-05-05 MED ORDER — ALBUTEROL SULFATE HFA 108 (90 BASE) MCG/ACT IN AERS
INHALATION_SPRAY | RESPIRATORY_TRACT | Status: AC
  Filled 2014-05-05: qty 6.7

## 2014-05-05 MED ORDER — THIAMINE HCL 100 MG/ML IJ SOLN
100.0000 mg | Freq: Every day | INTRAMUSCULAR | Status: DC
  Filled 2014-05-05 (×2): qty 1

## 2014-05-05 MED ORDER — METHOCARBAMOL 1000 MG/10ML IJ SOLN
500.0000 mg | Freq: Four times a day (QID) | INTRAVENOUS | Status: DC | PRN
  Filled 2014-05-05: qty 5

## 2014-05-05 MED ORDER — MENTHOL 3 MG MT LOZG: 1.0000 | LOZENGE | OROMUCOSAL | Status: DC | PRN

## 2014-05-05 MED ORDER — TRANEXAMIC ACID 100 MG/ML IV SOLN
1000.0000 mg | INTRAVENOUS | Status: DC | PRN
  Administered 2014-05-05: 1000 mg via INTRAVENOUS

## 2014-05-05 MED ORDER — LACTATED RINGERS IV SOLN
INTRAVENOUS | Status: DC
  Administered 2014-05-05: 12:00:00 via INTRAVENOUS

## 2014-05-05 MED ORDER — OXYCODONE HCL 5 MG PO TABS: 5.0000 mg | ORAL_TABLET | Freq: Four times a day (QID) | ORAL | Status: DC | PRN

## 2014-05-05 MED ORDER — DEXAMETHASONE SODIUM PHOSPHATE 10 MG/ML IJ SOLN
INTRAMUSCULAR | Status: AC
  Filled 2014-05-05: qty 1

## 2014-05-05 MED ORDER — ARTIFICIAL TEARS OP OINT
TOPICAL_OINTMENT | OPHTHALMIC | Status: AC
  Filled 2014-05-05: qty 3.5

## 2014-05-05 MED ORDER — OXYCODONE HCL 5 MG/5ML PO SOLN: 5.0000 mg | Freq: Once | ORAL | Status: AC | PRN

## 2014-05-05 MED ORDER — OXYCODONE HCL 5 MG PO TABS
5.0000 mg | ORAL_TABLET | Freq: Once | ORAL | Status: AC | PRN
  Administered 2014-05-05: 5 mg via ORAL

## 2014-05-05 MED ORDER — KETOROLAC TROMETHAMINE 15 MG/ML IJ SOLN
15.0000 mg | Freq: Four times a day (QID) | INTRAMUSCULAR | Status: AC
  Administered 2014-05-06 (×3): 15 mg via INTRAVENOUS
  Filled 2014-05-05 (×3): qty 1

## 2014-05-05 MED ORDER — TRANEXAMIC ACID 100 MG/ML IV SOLN
1000.0000 mg | INTRAVENOUS | Status: DC
  Filled 2014-05-05: qty 10

## 2014-05-05 MED ORDER — OXYCODONE HCL 5 MG PO TABS
ORAL_TABLET | ORAL | Status: AC
  Filled 2014-05-05: qty 1

## 2014-05-05 MED ORDER — HYDROMORPHONE HCL PF 1 MG/ML IJ SOLN: 0.2500 mg | INTRAMUSCULAR | Status: DC | PRN

## 2014-05-05 MED ORDER — PROPOFOL 10 MG/ML IV BOLUS
INTRAVENOUS | Status: DC | PRN
  Administered 2014-05-05: 200 mg via INTRAVENOUS

## 2014-05-05 MED ORDER — LIDOCAINE HCL (CARDIAC) 20 MG/ML IV SOLN
INTRAVENOUS | Status: AC
  Filled 2014-05-05: qty 5

## 2014-05-05 MED ORDER — NEOSTIGMINE METHYLSULFATE 10 MG/10ML IV SOLN
INTRAVENOUS | Status: DC | PRN
  Administered 2014-05-05: 1 mg via INTRAVENOUS
  Administered 2014-05-05: 4 mg via INTRAVENOUS

## 2014-05-05 MED ORDER — DOCUSATE SODIUM 100 MG PO CAPS
100.0000 mg | ORAL_CAPSULE | Freq: Two times a day (BID) | ORAL | Status: DC
  Administered 2014-05-05 – 2014-05-07 (×4): 100 mg via ORAL
  Filled 2014-05-05 (×6): qty 1

## 2014-05-05 MED ORDER — ONDANSETRON HCL 4 MG PO TABS: 4.0000 mg | ORAL_TABLET | Freq: Four times a day (QID) | ORAL | Status: DC | PRN

## 2014-05-05 MED ORDER — LIDOCAINE HCL (CARDIAC) 20 MG/ML IV SOLN
INTRAVENOUS | Status: DC | PRN
  Administered 2014-05-05: 100 mg via INTRAVENOUS

## 2014-05-05 MED ORDER — LACTATED RINGERS IV SOLN
INTRAVENOUS | Status: DC | PRN
  Administered 2014-05-05 (×2): via INTRAVENOUS

## 2014-05-05 MED ORDER — HYDROMORPHONE HCL PF 1 MG/ML IJ SOLN
1.0000 mg | INTRAMUSCULAR | Status: DC | PRN
  Administered 2014-05-05: 2 mg via INTRAVENOUS
  Filled 2014-05-05: qty 2

## 2014-05-05 MED ORDER — VECURONIUM BROMIDE 10 MG IV SOLR
INTRAVENOUS | Status: DC | PRN
  Administered 2014-05-05 (×2): 2 mg via INTRAVENOUS
  Administered 2014-05-05: 1 mg via INTRAVENOUS
  Administered 2014-05-05 (×3): 2 mg via INTRAVENOUS

## 2014-05-05 MED ORDER — METHOCARBAMOL 500 MG PO TABS
500.0000 mg | ORAL_TABLET | Freq: Four times a day (QID) | ORAL | Status: DC | PRN
Start: 2014-05-05 — End: 2014-05-07
  Administered 2014-05-05 – 2014-05-07 (×5): 500 mg via ORAL
  Filled 2014-05-05 (×4): qty 1

## 2014-05-05 MED ORDER — LORAZEPAM 1 MG PO TABS
1.0000 mg | ORAL_TABLET | Freq: Four times a day (QID) | ORAL | Status: DC | PRN
  Administered 2014-05-06 – 2014-05-07 (×4): 1 mg via ORAL
  Filled 2014-05-05 (×4): qty 1

## 2014-05-05 MED ORDER — ALUM & MAG HYDROXIDE-SIMETH 200-200-20 MG/5ML PO SUSP: 30.0000 mL | ORAL | Status: DC | PRN

## 2014-05-05 MED ORDER — BISACODYL 5 MG PO TBEC: 5.0000 mg | DELAYED_RELEASE_TABLET | Freq: Every day | ORAL | Status: DC | PRN

## 2014-05-05 MED ORDER — METHOCARBAMOL 500 MG PO TABS
ORAL_TABLET | ORAL | Status: AC
  Filled 2014-05-05: qty 1

## 2014-05-05 MED ORDER — LORAZEPAM 2 MG/ML IJ SOLN
INTRAMUSCULAR | Status: AC
  Filled 2014-05-05: qty 1

## 2014-05-05 SURGICAL SUPPLY — 53 items
APL SKNCLS STERI-STRIP NONHPOA (GAUZE/BANDAGES/DRESSINGS) ×1
BANDAGE GAUZE ELAST BULKY 4 IN (GAUZE/BANDAGES/DRESSINGS) IMPLANT
BENZOIN TINCTURE PRP APPL 2/3 (GAUZE/BANDAGES/DRESSINGS) ×3 IMPLANT
BLADE 10 SAFETY STRL DISP (BLADE) ×3 IMPLANT
BLADE SAW SGTL 18X1.27X75 (BLADE) ×2 IMPLANT
BLADE SAW SGTL 18X1.27X75MM (BLADE) ×1
BLADE SURG ROTATE 9660 (MISCELLANEOUS) IMPLANT
BNDG COHESIVE 6X5 TAN STRL LF (GAUZE/BANDAGES/DRESSINGS) IMPLANT
CAPT HIP PF COP ×3 IMPLANT
CELLS DAT CNTRL 66122 CELL SVR (MISCELLANEOUS) ×1 IMPLANT
CLOSURE STERI-STRIP 1/2X4 (GAUZE/BANDAGES/DRESSINGS)
CLSR STERI-STRIP ANTIMIC 1/2X4 (GAUZE/BANDAGES/DRESSINGS) IMPLANT
COVER SURGICAL LIGHT HANDLE (MISCELLANEOUS) ×3 IMPLANT
DRAPE C-ARM 42X72 X-RAY (DRAPES) ×3 IMPLANT
DRAPE STERI IOBAN 125X83 (DRAPES) ×3 IMPLANT
DRAPE U-SHAPE 47X51 STRL (DRAPES) ×9 IMPLANT
DRSG MEPILEX BORDER 4X8 (GAUZE/BANDAGES/DRESSINGS) ×3 IMPLANT
DURAPREP 26ML APPLICATOR (WOUND CARE) ×3 IMPLANT
ELECT BLADE 4.0 EZ CLEAN MEGAD (MISCELLANEOUS)
ELECT CAUTERY BLADE 6.4 (BLADE) ×3 IMPLANT
ELECT REM PT RETURN 9FT ADLT (ELECTROSURGICAL) ×3
ELECTRODE BLDE 4.0 EZ CLN MEGD (MISCELLANEOUS) IMPLANT
ELECTRODE REM PT RTRN 9FT ADLT (ELECTROSURGICAL) ×1 IMPLANT
GAUZE XEROFORM 1X8 LF (GAUZE/BANDAGES/DRESSINGS) ×3 IMPLANT
GLOVE BIOGEL PI IND STRL 8 (GLOVE) ×2 IMPLANT
GLOVE BIOGEL PI INDICATOR 8 (GLOVE) ×4
GLOVE ECLIPSE 7.5 STRL STRAW (GLOVE) ×6 IMPLANT
GOWN STRL REUS W/ TWL LRG LVL3 (GOWN DISPOSABLE) ×2 IMPLANT
GOWN STRL REUS W/ TWL XL LVL3 (GOWN DISPOSABLE) ×2 IMPLANT
GOWN STRL REUS W/TWL LRG LVL3 (GOWN DISPOSABLE) ×4
GOWN STRL REUS W/TWL XL LVL3 (GOWN DISPOSABLE) ×6
HOOD PEEL AWAY FACE SHEILD DIS (HOOD) ×6 IMPLANT
KIT BASIN OR (CUSTOM PROCEDURE TRAY) ×3 IMPLANT
KIT ROOM TURNOVER OR (KITS) ×3 IMPLANT
MANIFOLD NEPTUNE II (INSTRUMENTS) ×3 IMPLANT
NS IRRIG 1000ML POUR BTL (IV SOLUTION) ×3 IMPLANT
PACK TOTAL JOINT (CUSTOM PROCEDURE TRAY) ×3 IMPLANT
PAD ARMBOARD 7.5X6 YLW CONV (MISCELLANEOUS) ×6 IMPLANT
RTRCTR WOUND ALEXIS 18CM MED (MISCELLANEOUS) ×3
SPONGE LAP 18X18 X RAY DECT (DISPOSABLE) IMPLANT
STAPLER VISISTAT 35W (STAPLE) IMPLANT
SUT ETHIBOND NAB CT1 #1 30IN (SUTURE) ×6 IMPLANT
SUT MNCRL AB 3-0 PS2 18 (SUTURE) IMPLANT
SUT VIC AB 0 CT1 27 (SUTURE) ×3
SUT VIC AB 0 CT1 27XBRD ANBCTR (SUTURE) ×1 IMPLANT
SUT VIC AB 1 CT1 27 (SUTURE) ×12
SUT VIC AB 1 CT1 27XBRD ANBCTR (SUTURE) ×4 IMPLANT
SUT VIC AB 2-0 CT1 27 (SUTURE) ×3
SUT VIC AB 2-0 CT1 TAPERPNT 27 (SUTURE) ×1 IMPLANT
TOWEL OR 17X24 6PK STRL BLUE (TOWEL DISPOSABLE) ×3 IMPLANT
TOWEL OR 17X26 10 PK STRL BLUE (TOWEL DISPOSABLE) ×3 IMPLANT
TRAY FOLEY CATH 16FR SILVER (SET/KITS/TRAYS/PACK) IMPLANT
WATER STERILE IRR 1000ML POUR (IV SOLUTION) ×6 IMPLANT

## 2014-05-05 NOTE — Discharge Instructions (Signed)
Total Hip Replacement °Care After °Refer to this sheet in the next few weeks. These instructions provide you with information on caring for yourself after your procedure. Your caregiver also may give you specific instructions. Your treatment has been planned according to the most current medical practices, but problems sometimes occur. Call your caregiver if you have any problems or questions after your procedure. °HOME CARE INSTRUCTIONS  °Your caregiver will give you specific precautions for certain types of movement. Additional instructions include: °· Take over-the-counter or prescription medicines for pain, discomfort, or fever only as directed by your caregiver. °· Take quick showers (3 5 min) rather than bathe until your caregiver tells you that you can take baths again. °· Avoid lifting until your caregiver instructs you otherwise. °· Use a raised toilet seat and avoid sitting in low chairs as instructed by your caregiver. °· Use crutches or a Haen as instructed by your caregiver. °SEEK MEDICAL CARE IF: °· You have difficulty breathing. °· Your wound is red, swollen, or has become increasingly painful. °· You have pus draining from your wound. °· You have a bad smell coming from your wound. °· You have persistent bleeding from your wound. °· Your wound breaks open after sutures (stitches) or staples have been removed. °SEEK IMMEDIATE MEDICAL CARE IF:  °· You have a fever. °· You have a rash. °· You have pain or swelling in your calf or thigh. °· You have shortness of breath or chest pain. °MAKE SURE YOU: °· Understand these instructions. °· Will watch your condition. °· Will get help right away if you are not doing well or get worse. °Document Released: 06/29/2005 Document Revised: 06/10/2012 Document Reviewed: 01/27/2012 °ExitCare® Patient Information ©2014 [employer]. ° °
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
## 2014-05-05 NOTE — Progress Notes (Signed)
RT Note: Patient arrived from the OR still intubated and was placed on the ventilator. He was placed on Cpap/Ps per the CRNA. Patient is tolerating settings well. BBS-clear and equal and bilateral chest rise was noted. Rt will continue to monitor.

## 2014-05-05 NOTE — Progress Notes (Signed)
Pt says he is feeling better after IV Ativan, sleeps when left alone, maintaining O2sats > 92% on 4L humidifued O2. Uses IS up to 2L.

## 2014-05-05 NOTE — Progress Notes (Signed)
Care of pt assumed by MA Masyn Fullam RN 

## 2014-05-05 NOTE — Anesthesia Preprocedure Evaluation (Signed)
Anesthesia Evaluation  Patient identified by MRN, date of birth, ID band Patient awake    Reviewed: Allergy & Precautions, H&P , NPO status , Patient's Chart, lab work & pertinent test results  Airway Mallampati: II  Neck ROM: full    Dental   Pulmonary shortness of breath, COPDCurrent Smoker,          Cardiovascular hypertension,     Neuro/Psych Anxiety Depression    GI/Hepatic GERD-  ,(+)     substance abuse  alcohol use,   Endo/Other  obese  Renal/GU      Musculoskeletal   Abdominal   Peds  Hematology   Anesthesia Other Findings   Reproductive/Obstetrics                           Anesthesia Physical Anesthesia Plan  ASA: II  Anesthesia Plan: General   Post-op Pain Management:    Induction: Intravenous  Airway Management Planned: Oral ETT  Additional Equipment:   Intra-op Plan:   Post-operative Plan: Extubation in OR  Informed Consent: I have reviewed the patients History and Physical, chart, labs and discussed the procedure including the risks, benefits and alternatives for the proposed anesthesia with the patient or authorized representative who has indicated his/her understanding and acceptance.     Plan Discussed with: CRNA, Anesthesiologist and Surgeon  Anesthesia Plan Comments:         Anesthesia Quick Evaluation

## 2014-05-05 NOTE — Transfer of Care (Signed)
Immediate Anesthesia Transfer of Care Note  Patient: Ruben Sutton  Procedure(s) Performed: Procedure(s): RIGHT TOTAL HIP ARTHROPLASTY ANTERIOR APPROACH (Right)  Patient Location: PACU  Anesthesia Type:General  Level of Consciousness: lethargic and Patient remains intubated per anesthesia plan  Airway & Oxygen Therapy: Patient Spontanous Breathing, Patient remains intubated per anesthesia plan and Patient placed on Ventilator (see vital sign flow sheet for setting)  Post-op Assessment: Report given to PACU RN and Post -op Vital signs reviewed and stable  Post vital signs: Reviewed and stable  Complications: No apparent anesthesia complications

## 2014-05-05 NOTE — Anesthesia Procedure Notes (Signed)
Procedure Name: Intubation Date/Time: 05/05/2014 2:46 PM Performed by: Gayla MedicusHYPES, Oluwatimilehin Balfour M. Pre-anesthesia Checklist: Patient identified, Patient being monitored, Emergency Drugs available, Timeout performed and Suction available Patient Re-evaluated:Patient Re-evaluated prior to inductionOxygen Delivery Method: Circle system utilized Preoxygenation: Pre-oxygenation with 100% oxygen Intubation Type: IV induction Ventilation: Mask ventilation without difficulty and Oral airway inserted - appropriate to patient size Laryngoscope Size: Mac and 4 Grade View: Grade II Tube type: Oral Tube size: 7.5 mm Number of attempts: 1 Airway Equipment and Method: Stylet Placement Confirmation: ETT inserted through vocal cords under direct vision,  positive ETCO2 and breath sounds checked- equal and bilateral Secured at: 22 cm Tube secured with: Tape Dental Injury: Teeth and Oropharynx as per pre-operative assessment

## 2014-05-05 NOTE — Brief Op Note (Signed)
05/05/2014  6:00 PM  PATIENT:  Addison Baileyoger R Hollister  38 y.o. male  PRE-OPERATIVE DIAGNOSIS:  Avascular Necrosis of Hip   POST-OPERATIVE DIAGNOSIS:  Avascular Necrosis of Hip   PROCEDURE:  Procedure(s): RIGHT TOTAL HIP ARTHROPLASTY ANTERIOR APPROACH (Right)  SURGEON:  Surgeon(s) and Role:    * Harvie JuniorJohn L Amarri Satterly, MD - Primary  PHYSICIAN ASSISTANT:   ASSISTANTS: bethune   ANESTHESIA:   general  EBL:  Total I/O In: 1600 [I.V.:1600] Out: 750 [Blood:750]  BLOOD ADMINISTERED:none  DRAINS: none   LOCAL MEDICATIONS USED:  NONE  SPECIMEN:  No Specimen  DISPOSITION OF SPECIMEN:  N/A  COUNTS:  YES  TOURNIQUET:  * No tourniquets in log *  DICTATION: .Other Dictation: Dictation Number (240) [id number]  PLAN OF CARE: Admit to inpatient   PATIENT DISPOSITION:  PACU - hemodynamically stable.   Delay start of Pharmacological VTE agent (>24hrs) due to surgical blood loss or risk of bleeding: no

## 2014-05-05 NOTE — Progress Notes (Signed)
Pt says he feels "shaky". IV Ativan given. He wants to get out of PACU and to rm.

## 2014-05-05 NOTE — Anesthesia Postprocedure Evaluation (Signed)
Anesthesia Post Note  Patient: Ruben BaileyRoger R Sutton  Procedure(s) Performed: Procedure(s) (LRB): RIGHT TOTAL HIP ARTHROPLASTY ANTERIOR APPROACH (Right)  Anesthesia type: General  Patient location: PACU  Post pain: Pain level controlled and Adequate analgesia  Post assessment: Post-op Vital signs reviewed, Patient's Cardiovascular Status Stable, Respiratory Function Stable, Patent Airway and Pain level controlled  Last Vitals:  Filed Vitals:   05/05/14 1930  BP:   Pulse: 123  Temp:   Resp: 22    Post vital signs: Reviewed and stable  Level of consciousness: awake, alert  and oriented  Complications: No apparent anesthesia complications

## 2014-05-06 LAB — CBC
HCT: 44.9 % (ref 39.0–52.0)
HEMOGLOBIN: 14.8 g/dL (ref 13.0–17.0)
MCH: 34.3 pg — AB (ref 26.0–34.0)
MCHC: 33 g/dL (ref 30.0–36.0)
MCV: 104.2 fL — ABNORMAL HIGH (ref 78.0–100.0)
PLATELETS: 102 10*3/uL — AB (ref 150–400)
RBC: 4.31 MIL/uL (ref 4.22–5.81)
RDW: 14.2 % (ref 11.5–15.5)
WBC: 5.9 10*3/uL (ref 4.0–10.5)

## 2014-05-06 LAB — COMPREHENSIVE METABOLIC PANEL
ALT: 59 U/L — AB (ref 0–53)
AST: 102 U/L — AB (ref 0–37)
Albumin: 2.2 g/dL — ABNORMAL LOW (ref 3.5–5.2)
Alkaline Phosphatase: 101 U/L (ref 39–117)
BUN: 6 mg/dL (ref 6–23)
CALCIUM: 7.9 mg/dL — AB (ref 8.4–10.5)
CHLORIDE: 92 meq/L — AB (ref 96–112)
CO2: 27 meq/L (ref 19–32)
Creatinine, Ser: 0.66 mg/dL (ref 0.50–1.35)
GFR calc Af Amer: >90 mL/min (ref >90)
GFR calc non Af Amer: >90 mL/min (ref >90)
Glucose, Bld: 249 mg/dL — ABNORMAL HIGH (ref 70–99)
Potassium: 4.7 mEq/L (ref 3.7–5.3)
Sodium: 131 mEq/L — ABNORMAL LOW (ref 137–147)
Total Bilirubin: 0.3 mg/dL (ref 0.3–1.2)
Total Protein: 6.9 g/dL (ref 6.0–8.3)

## 2014-05-06 NOTE — Evaluation (Signed)
Physical Therapy Evaluation Patient Details Name: Ruben BaileyRoger R Shutes MRN: [id number] DOB: 10/19/1976 Today's Date: 05/06/2014   History of Present Illness  elective direct anterior THR on right  on 05/05/14; PMH COPD, ETOH  Clinical Impression  Patient did well with mobility although limited by increased pain.  Patient requiring overall min assist for mobility.  Patient will benefit from PT to increase independence and mobility for discharge home.    Follow Up Recommendations Home health PT    Equipment Recommendations  Rolling Korb with 5" wheels;3in1 (PT)    Recommendations for Other Services       Precautions / Restrictions Precautions Precautions: None Restrictions Weight Bearing Restrictions: Yes RLE Weight Bearing: Weight bearing as tolerated      Mobility  Bed Mobility Overal bed mobility: Needs Assistance Bed Mobility: Supine to Sit     Supine to sit: Min assist     General bed mobility comments: assist for RLE  Transfers Overall transfer level: Needs assistance Equipment used: Rolling Brazell (2 wheeled) Transfers: Sit to/from Stand Sit to Stand: Min assist         General transfer comment: cueing for proper sequencing  Ambulation/Gait Ambulation/Gait assistance: Min assist Ambulation Distance (Feet): 10 Feet Assistive device: Rolling Layne (2 wheeled) Gait Pattern/deviations: Step-to pattern Gait velocity: decreased      Stairs            Wheelchair Mobility    Modified Rankin (Stroke Patients Only)       Balance Overall balance assessment: Needs assistance Sitting-balance support: No upper extremity supported Sitting balance-Leahy Scale: Fair     Standing balance support: Bilateral upper extremity supported Standing balance-Leahy Scale: Poor                               Pertinent Vitals/Pain 9/10 right leg.  Patient was premedicated prior to session, monitored pain during session.  Notified nursing post tx.     Home Living Family/patient expects to be discharged to:: Private residence Living Arrangements: Other relatives Available Help at Discharge: Family Type of Home: House Home Access: Stairs to enter Entrance Stairs-Rails: Right Entrance Stairs-Number of Steps: 3 Home Layout: One level Home Equipment: None Additional Comments: lives with grandmother and brother.  Per chart, grandmother has dementia    Prior Function Level of Independence: Independent               Hand Dominance   Dominant Hand: Right    Extremity/Trunk Assessment   Upper Extremity Assessment: Overall WFL for tasks assessed           Lower Extremity Assessment: RLE deficits/detail         Communication   Communication: No difficulties  Cognition Arousal/Alertness: Awake/alert Behavior During Therapy: Restless Overall Cognitive Status: Within Functional Limits for tasks assessed                      General Comments      Exercises Total Joint Exercises Ankle Circles/Pumps: AROM;Both;10 reps;Supine Quad Sets: AROM;Both;10 reps;Seated      Assessment/Plan    PT Assessment Patient needs continued PT services  PT Diagnosis Acute pain;Difficulty walking   PT Problem List Decreased activity tolerance;Decreased balance;Decreased mobility;Decreased knowledge of use of DME;Pain  PT Treatment Interventions DME instruction;Stair training;Gait training;Functional mobility training;Therapeutic activities;Therapeutic exercise;Patient/family education   PT Goals (Current goals can be found in the Care Plan section) Acute Rehab PT Goals Patient Stated Goal:  go home tomorrow PT Goal Formulation: With patient Time For Goal Achievement: 05/10/14 Potential to Achieve Goals: Good    Frequency 7X/week   Barriers to discharge        Co-evaluation               End of Session Equipment Utilized During Treatment: Gait belt Activity Tolerance: Patient limited by pain Patient left:  in chair;with call bell/phone within reach;with family/visitor present Nurse Communication: Patient requests pain meds         Time: 1108-1130 PT Time Calculation (min): 22 min   Charges:   PT Evaluation $Initial PT Evaluation Tier I: 1 Procedure PT Treatments $Gait Training: 8-22 mins   PT G CodesOlivia Canter:          Evalie Hargraves M Kawehi Hostetter, [place] CarolinaPT [phone] 05/06/2014, 12:05 PM

## 2014-05-06 NOTE — Progress Notes (Signed)
Subjective: 1 Day Post-Op Procedure(s) (LRB): RIGHT TOTAL HIP ARTHROPLASTY ANTERIOR APPROACH (Right) Patient reports pain as moderate and severe.    Objective: Vital signs in last 24 hours: Temp:  [97.2 F (36.2 C)-98.4 F (36.9 C)] 97.2 F (36.2 C) (05/14 0513) Pulse Rate:  [101-146] 102 (05/14 0513) Resp:  [13-22] 18 (05/14 0513) BP: (153-183)/(74-108) 183/106 mmHg (05/14 0513) SpO2:  [88 %-100 %] 95 % (05/14 0513) FiO2 (%):  [50 %] 50 % (05/13 1845) Weight:  [262 lb (118.842 kg)-262 lb 8 oz (119.069 kg)] 262 lb (118.842 kg) (05/13 1142)  Intake/Output from previous day: 05/13 0701 - 05/14 0700 In: 2150 [P.O.:300; I.V.:1850] Out: 2050 [Urine:1300; Blood:750] Intake/Output this shift:     Recent Labs  05/06/14 0548  HGB 14.8    Recent Labs  05/06/14 0548  WBC 5.9  RBC 4.31  HCT 44.9  PLT 102*    Recent Labs  05/06/14 0548  NA 131*  K 4.7  CL 92*  CO2 27  BUN 6  CREATININE 0.66  GLUCOSE 249*  CALCIUM 7.9*   No results found for this basename: LABPT, INR,  in the last 72 hours  Neurologically intact ABD soft Neurovascular intact Sensation intact distally Intact pulses distally No cellulitis present Compartment soft  Assessment/Plan: 1 Day Post-Op Procedure(s) (LRB): RIGHT TOTAL HIP ARTHROPLASTY ANTERIOR APPROACH (Right) Advance diet Up with therapy Plan for discharge tomorrow Monitor for signs of withdrawal  Harvie JuniorJohn L Tuere Nwosu 05/06/2014, 7:49 AM

## 2014-05-06 NOTE — Progress Notes (Signed)
OT Cancellation Note  Patient Details Name: Addison BaileyRoger R Hargens MRN: [id number] DOB: 02/14/1976   Cancelled Treatment:    Reason Eval/Treat Not Completed: Patient declined, no reason specified. Pt visited this date to assess ADL performance. Pt reports that his brother will stay with him and he has no ADL concerns. OT offered opportunity to mobilize, however pt reports that he "did too much with [his] brother" and is fatigued now. Pt declined OT at this time and reports that he does not need assistance as his brother will be able to help. Acute OT to sign off at this time.   Rae LipsLeeann M Advika Mclelland [phone] 05/06/2014, 2:39 PM

## 2014-05-06 NOTE — Op Note (Signed)
NAMSkeet Latch:  Tokarczyk, Lyn                ACCOUNT NO.:  [id number]  MEDICAL RECORD NO.:  [id number]  LOCATION:  [id number]                        FACILITY:  [facility]  PHYSICIAN:  Harvie JuniorJohn L. Amaya Blakeman, M.D.   DATE OF BIRTH:  01/26/1976  DATE OF PROCEDURE:  05/05/2014 DATE OF DISCHARGE:                              OPERATIVE REPORT   PREOPERATIVE DIAGNOSIS:  Avascular necrosis, right hip.  POSTOPERATIVE DIAGNOSIS:  Avascular necrosis, right hip.  PROCEDURE:  Right total hip replacement with a Corail size 11 stem, a 56 mm pinnacle Gription cup, +4 neutral liner, and a 36 mm delta ceramic hip ball.  SURGEON:  Harvie JuniorJohn L. Ra Pfiester, MD  ASSISTANT:  Marshia LyJames Bethune, PA-C  ANESTHESIA:  General.  BRIEF HISTORY:  Mr. [name] is a [age] year old male with a significant and prolonged alcohol history who has had significant and severe bilateral hip pain.  He had been treated in our office for a period of time because of severe hip pain, MRI was obtained, and showed that he had bilateral avascular necrosis of the hips.  Right hip became so bad that he was having difficult time standing and walking because of failure of all conservative cares, taken to operating room for right total hip replacement.  Because of his young age and need to give care to a grandmother, we felt an anterior total hip approach was appropriate and he was taken to operating room for this procedure.  DESCRIPTION OF PROCEDURE:  The patient was taken to operating room after adequate anesthesia was obtained with general anesthetic, patient was placed supine on operating table.  Right hip was then prepped and draped in usual sterile fashion.  After putting the patient asleep, he was placed on the Hana bed and once this was done, the belly was taped over and he was then prepped and draped in usual sterile fashion.  Following this, an incision was made for an anterior approach to the hip, subcutaneous tissue, down the level of the extensor fascia,  clearly identified and then the fascia was divided.  The muscle was then released from the posterior aspect of the fascia and then down over to the hip joint.  Cobb elevator was used to retract the capsule anteriorly and then a Cobra was put in place, from the front of the Cobra and then the vessels were cauterized at this point, and then the tensor fascia was then released so that it was mobile.  Once that was completed, attention was turned towards the capsule which was incised and then taking down along the shoulder of the hip, the anterior and posterior leaflets were tagged and then the hip provisional neck cut was made after the traction was put back in place.  Hip ball was removed.  There was a dramatic delamination of almost all the cartilage off of the head ball, this was removed and then the acetabulum was identified. Retractors were put in place, it was sequentially reamed to a level of 55 and 56 mm Gription cup was placed and a 45 degrees of lateral opening and 25 degrees of anteversion.  Once this was done, a +4 neutral liner was placed.  Attention was then turned to the  stem side where retractors were put in place.  Hip was excellent, rotated, extended and adducted and then attention was turned towards placement of retractors and then the femur was sequentially reamed up to a level of 11 and 11 fit perfectly.  The posterior ball was used.  Rejection was undertaken slightly long, so we went ahead and countersunk the stem, co-planed it down, put in a size 11 Corail stem with a +0 delta ceramic hip ball, reduced the hip, externally rotated to 90, extended to 60.  No tendency towards subluxation or dislocation.  At this point, the wound was copiously and thoroughly lavaged, suctioned dry.  Final fluoro images were taken, and once this was completed, the anterior hip capsule was closed.  Tensor fascia was closed with running suture and the skin was closed with 0 and 2-0 Vicryl and  skin with staples.  Sterile compressive dressing was applied, and the patient was taken to recovery, was noted to be in satisfactory condition.  Estimated blood loss for the procedure was minimal.     Harvie JuniorJohn L. Rachelanne Whidby, M.D.     Ranae PlumberJLG/MEDQ  D:  05/05/2014  T:  05/06/2014  Job:  [id number]

## 2014-05-06 NOTE — Plan of Care (Signed)
Problem: Consults Goal: Diagnosis- Total Joint Replacement Primary Total Hip Right     

## 2014-05-06 NOTE — Progress Notes (Signed)
MD, patient has been having elevated BP ie; 158/101, 183/106  He stated that he does not take BP medication.  Please address. Thank you.

## 2014-05-06 NOTE — Progress Notes (Signed)
Admitted for total hip replacement.  Noted that lab glucose today was 249 mg/dl. Is on Decadron.  Recommend checking CBGs AC & HS and starting Novolog SENSITIVE correction scale AC & HS while in the hospital.  Check HgbA1C. Will follow while in hospital. Smith MinceKendra Korby Ratay RN BSN CDE

## 2014-05-06 NOTE — Progress Notes (Signed)
Utilization review completed.  

## 2014-05-06 NOTE — Care Management Note (Signed)
CARE MANAGEMENT NOTE 05/06/2014  Patient:  Ruben Sutton,Ruben Sutton   Account Number:  [id number]  Date Initiated:  05/06/2014  Documentation initiated by:  Vance PeperBRADY,Joden Bonsall  Subjective/Objective Assessment:   38 yr old male s/p right anterior hip arthroplasty.     Action/Plan:   case manager spoke with patient concerning home health and DME needs at discharge. [facility]   Anticipated DC Date:  05/07/2014   Anticipated DC Plan:  HOME W HOME HEALTH SERVICES      DC Planning Services  CM consult      [facility]  [facility]  DURABLE MEDICAL EQUIPMENT   Choice offered to / List presented to:     DME arranged  [place] [facility]      DME agency  [facility]     HH arranged  HH[facility]2 PT      [facility]   Status of service:  Completed, signed off Medicare Important Message given?   (If response is "NO", the following Medicare IM given date fields will be blank) Date Medicare IM given:   Date Additional Medicare IM given:    Discharge Disposition:  HOME W HOME HEALTH SERVICES  Per UR Regulation:

## 2014-05-06 NOTE — Progress Notes (Signed)
Physical Therapy Treatment Patient Details Name: Ruben BaileyRoger R Ellery MRN: [id number] DOB: 11/24/1976 Today's Date: 05/06/2014    History of Present Illness elective direct anterior THR on right  on 05/05/14; PMH COPD, ETOH    PT Comments    Patient with limited treatment this pm. Pt reports he has been ambulating with brother and is too fatigued to ambulate again.  Pt did perform exercise and reports his hip feel "much better" this pm.  Will see patient again tomorrow to determine if ready for d/c.  Follow Up Recommendations  Home health PT     Equipment Recommendations  Rolling Rorrer with 5" wheels;3in1 (PT)    Recommendations for Other Services       Precautions / Restrictions Precautions Precautions: None Restrictions RLE Weight Bearing: Weight bearing as tolerated    Mobility  Bed Mobility Overal bed mobility: Needs Assistance Bed Mobility: Supine to Sit     Supine to sit: Min assist     General bed mobility comments: assist for RLE  Transfers Overall transfer level: Needs assistance Equipment used: Rolling Mallari (2 wheeled) Transfers: Sit to/from Stand Sit to Stand: Min assist         General transfer comment: cueing for proper sequencing  Ambulation/Gait Ambulation/Gait assistance: Min assist Ambulation Distance (Feet): 10 Feet Assistive device: Rolling Violante (2 wheeled) Gait Pattern/deviations: Step-to pattern Gait velocity: decreased   General Gait Details: patient deferred OOB this pm - just ambulating with brother per pt.   Stairs            Wheelchair Mobility    Modified Rankin (Stroke Patients Only)       Balance Overall balance assessment: Needs assistance Sitting-balance support: No upper extremity supported Sitting balance-Leahy Scale: Fair     Standing balance support: Bilateral upper extremity supported Standing balance-Leahy Scale: Poor                      Cognition Arousal/Alertness: Awake/alert Behavior  During Therapy: WFL for tasks assessed/performed Overall Cognitive Status: Within Functional Limits for tasks assessed                      Exercises Total Joint Exercises Ankle Circles/Pumps: AROM;Both;10 reps;Supine Quad Sets: AROM;Both;10 reps;Seated Short Arc Quad: AROM;Right;10 reps;Supine Heel Slides: AROM;Right;10 reps;Supine Hip ABduction/ADduction: AAROM;Right;10 reps;Supine    General Comments        Pertinent Vitals/Pain "much better" and asking for pain meds.  RN notified.    Home Living Family/patient expects to be discharged to:: Private residence Living Arrangements: Other relatives Available Help at Discharge: Family Type of Home: House Home Access: Stairs to enter Entrance Stairs-Rails: Right Home Layout: One level Home Equipment: None Additional Comments: lives with grandmother and brother.  Per chart, grandmother has dementia    Prior Function Level of Independence: Independent          PT Goals (current goals can now be found in the care plan section) Acute Rehab PT Goals Patient Stated Goal: go home tomorrow PT Goal Formulation: With patient Time For Goal Achievement: 05/10/14 Potential to Achieve Goals: Good Progress towards PT goals: Progressing toward goals    Frequency  7X/week    PT Plan Current plan remains appropriate    Co-evaluation             End of Session Equipment Utilized During Treatment: Gait belt Activity Tolerance: Patient limited by fatigue Patient left: in bed;with call bell/phone within reach  Time: [id number] PT Time Calculation (min): 8 min  Charges:  $Gait Training: 8-22 mins $Therapeutic Exercise: 8-22 mins                    G CodesSela Hilding:      Cleo Santucci M PrattMoton, South CarolinaPT [phone] 05/06/2014, 2:54 PM

## 2014-05-07 ENCOUNTER — Encounter (HOSPITAL_COMMUNITY): Payer: Self-pay | Admitting: Orthopedic Surgery

## 2014-05-07 DIAGNOSIS — R062 Wheezing: Secondary | ICD-10-CM | POA: Diagnosis not present

## 2014-05-07 LAB — CBC
HCT: 39.1 % (ref 39.0–52.0)
HEMOGLOBIN: 12.4 g/dL — AB (ref 13.0–17.0)
MCH: 33.2 pg (ref 26.0–34.0)
MCHC: 31.7 g/dL (ref 30.0–36.0)
MCV: 104.8 fL — ABNORMAL HIGH (ref 78.0–100.0)
Platelets: 92 10*3/uL — ABNORMAL LOW (ref 150–400)
RBC: 3.73 MIL/uL — ABNORMAL LOW (ref 4.22–5.81)
RDW: 14.3 % (ref 11.5–15.5)
WBC: 6.4 10*3/uL (ref 4.0–10.5)

## 2014-05-07 LAB — COMPREHENSIVE METABOLIC PANEL
ALBUMIN: 2.1 g/dL — AB (ref 3.5–5.2)
ALT: 41 U/L (ref 0–53)
AST: 68 U/L — ABNORMAL HIGH (ref 0–37)
Alkaline Phosphatase: 90 U/L (ref 39–117)
BUN: 9 mg/dL (ref 6–23)
CALCIUM: 8.3 mg/dL — AB (ref 8.4–10.5)
CO2: 30 mEq/L (ref 19–32)
Chloride: 97 mEq/L (ref 96–112)
Creatinine, Ser: 0.62 mg/dL (ref 0.50–1.35)
GFR calc Af Amer: >90 mL/min (ref >90)
GFR calc non Af Amer: >90 mL/min (ref >90)
GLUCOSE: 150 mg/dL — AB (ref 70–99)
Potassium: 4.5 mEq/L (ref 3.7–5.3)
SODIUM: 136 meq/L — AB (ref 137–147)
TOTAL PROTEIN: 6.4 g/dL (ref 6.0–8.3)
Total Bilirubin: 0.4 mg/dL (ref 0.3–1.2)

## 2014-05-07 MED ORDER — ALBUTEROL SULFATE (2.5 MG/3ML) 0.083% IN NEBU
2.5000 mg | INHALATION_SOLUTION | RESPIRATORY_TRACT | Status: DC | PRN
  Administered 2014-05-07: 2.5 mg via RESPIRATORY_TRACT

## 2014-05-07 MED ORDER — ALBUTEROL SULFATE HFA 108 (90 BASE) MCG/ACT IN AERS: 2.0000 | INHALATION_SPRAY | Freq: Four times a day (QID) | RESPIRATORY_TRACT | Status: AC | PRN

## 2014-05-07 MED ORDER — LISINOPRIL 10 MG PO TABS: 10.0000 mg | ORAL_TABLET | Freq: Every day | ORAL | Status: DC

## 2014-05-07 NOTE — Progress Notes (Signed)
Agree with SPT.    Cassiel Fernandez, PT [phone]  
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
## 2014-05-07 NOTE — Progress Notes (Addendum)
Subjective: 2 Days Post-Op Procedure(s) (LRB): RIGHT TOTAL HIP ARTHROPLASTY ANTERIOR APPROACH (Right) Patient reports pain as 4 on 0-10 scale.  Good progress with physical therapy Some wheezing this morning, it resolved after an albuterol hand-held nebulizer treatment The patient tells me that he has been prescribed blood pressure medication in the past, but he has not picked it up. I called his pharmacy and to blood pressure medications have been prescribed in 2014 one included lisinopril and another combination drug. Neither was picked up.  Objective: Vital signs in last 24 hours: Temp:  [97.9 F (36.6 C)-98.1 F (36.7 C)] 98.1 F (36.7 C) (05/15 0500) Pulse Rate:  [75-87] 75 (05/15 0500) Resp:  [18] 18 (05/15 0500) BP: (160-168)/(85-100) 163/85 mmHg (05/15 0500) SpO2:  [97 %-99 %] 99 % (05/15 0500)  Intake/Output from previous day: 05/14 0701 - 05/15 0700 In: 1823.3 [P.O.:480; I.V.:1343.3] Out: 3000 [Urine:3000] Intake/Output this shift:     Recent Labs  05/06/14 0548 05/07/14 0430  HGB 14.8 12.4*    Recent Labs  05/06/14 0548 05/07/14 0430  WBC 5.9 6.4  RBC 4.31 3.73*  HCT 44.9 39.1  PLT 102* 92*    Recent Labs  05/06/14 0548 05/07/14 0430  NA 131* 136*  K 4.7 4.5  CL 92* 97  CO2 27 30  BUN 6 9  CREATININE 0.66 0.62  GLUCOSE 249* 150*  CALCIUM 7.9* 8.3*   Right hip exam: Neurovascular intact Sensation intact distally Intact pulses distally Dorsiflexion/Plantar flexion intact Incision: dressing C/D/I Compartment soft Lungs: Clear to auscultation. Had some expiratory wheezing earlier this morning which resolved after a breathing treatment Assessment/Plan: 2 Days Post-Op Procedure(s) (LRB): RIGHT TOTAL HIP ARTHROPLASTY ANTERIOR APPROACH (Right) Expiratory wheezing, now resolved after breathing treatment. Hypertension Plan: Discharge home with home health Will give new Rx for lisinopril 10 mg 1 daily. Also given Rx for albuterol inhaler 2 puffs  every 6 hours as needed for wheezing Weight-bear as tolerated on the right. Will need home health physical therapy. Followup with Dr. Lerry Linerwight Williams in 10 days regarding his blood pressure Followup with Dr. Luiz BlareGraves in 2 weeks to  Matthew FolksJames G Shakiyla Kook 05/07/2014, 12:15 PM

## 2014-05-07 NOTE — Progress Notes (Signed)
Patient experienced exp wheezing.  PRN albuterol neb tx given per MD order.

## 2014-05-07 NOTE — Progress Notes (Signed)
Physical Therapy Treatment Patient Details Name: Ruben BaileyRoger R Sutton MRN: [id number] DOB: 06/09/1976 Today's Date: 05/07/2014    History of Present Illness elective direct anterior THR on right  on 05/05/14; PMH COPD, ETOH    PT Comments    Pt able to ambulate 150 ft today using RW and min guard with one standing rest for SOB.  Pt also demonstrated ability to ascend/descend 2 steps with bil. Hand rails, which he says is the same as his front door entrance.  Pt resistant to VC for safety with transfers and steps.  Required min-A to help get R LE back into bed with sit->supine transfer.  Will continue to follow.   Follow Up Recommendations  Home health PT     Equipment Recommendations  Rolling Byers with 5" wheels;3in1 (PT)    Recommendations for Other Services       Precautions / Restrictions Precautions Precautions: None Restrictions Weight Bearing Restrictions: Yes RLE Weight Bearing: Weight bearing as tolerated    Mobility  Bed Mobility Overal bed mobility: Needs Assistance Bed Mobility: Supine to Sit;Sit to Supine     Supine to sit: Supervision;HOB elevated Sit to supine: Min assist   General bed mobility comments: Pt quickly rose from supine to EOB without cueing.  Required min-A to assist lifting R LE back into bed to return to supine from sitting at end of session.  Transfers Overall transfer level: Needs assistance Equipment used: Rolling Nahar (2 wheeled) Transfers: Sit to/from Stand Sit to Stand: Min guard         General transfer comment: VC given for proper sequencing and safety.  Pt resistant to verbal cues, prefers to use B UE on [place] to sit/stand.  "I'll do things the way I do them."  Ambulation/Gait Ambulation/Gait assistance: Min guard Ambulation Distance (Feet): 150 Feet Assistive device: Rolling Boozer (2 wheeled) Gait Pattern/deviations: Step-to pattern;Decreased stance time - right;Decreased step length - left;Decreased weight shift to  right Gait velocity: decreased Gait velocity interpretation: Below normal speed for age/gender General Gait Details: Pt initially refused ambulation, once standing was able to ambulate 150 ft with standing rest for SOB then 2 steps up/down after first 75 ft.   Stairs Stairs: Yes Stairs assistance: Min guard Stair Management: Two rails;Step to pattern;Forwards Number of Stairs: 2 General stair comments: Pt reports that he can use front stairs at his house to use both rails within reach for 2 steps.  Pt resistant to VC for RW strategy and safety.  Wheelchair Mobility    Modified Rankin (Stroke Patients Only)       Balance Overall balance assessment: Needs assistance         Standing balance support: Single extremity supported Standing balance-Leahy Scale: Poor                      Cognition Arousal/Alertness: Awake/alert Behavior During Therapy: WFL for tasks assessed/performed Overall Cognitive Status: Within Functional Limits for tasks assessed                      Exercises      General Comments        Pertinent Vitals/Pain Pt reported pain before getting out of bed, but reported less pain with ambulation.  Pt premedicated.    Home Living                      Prior Function  PT Goals (current goals can now be found in the care plan section) Acute Rehab PT Goals Patient Stated Goal: Go home today PT Goal Formulation: With patient Time For Goal Achievement: 05/10/14 Potential to Achieve Goals: Good Progress towards PT goals: Progressing toward goals    Frequency  7X/week    PT Plan Current plan remains appropriate    Co-evaluation             End of Session Equipment Utilized During Treatment: Gait belt Activity Tolerance: Patient tolerated treatment well Patient left: in bed;with call bell/phone within reach     Time: 1026-1045 PT Time Calculation (min): 19 min  Charges:                       G  Codes:      Raina Sole, SPT 05/07/2014, 11:03 AM

## 2014-05-07 NOTE — Progress Notes (Signed)
Bilateral lungs assessed, pt has less exp and insp wheezing, no SOB.  PA in room.  Nsg to continue to monitor for status changes.

## 2014-06-18 NOTE — OR Nursing (Signed)
Addendum to scope page 

## 2014-09-20 ENCOUNTER — Other Ambulatory Visit: Payer: Self-pay | Admitting: Orthopedic Surgery

## 2014-09-28 ENCOUNTER — Encounter (HOSPITAL_COMMUNITY): Payer: Self-pay | Admitting: Pharmacy Technician

## 2014-09-30 NOTE — Pre-Procedure Instructions (Signed)
Ruben BaileyRoger R Sutton  09/30/2014   Your procedure is scheduled on:  Monday, October 19th  [facility]mitting at 1015 AM.  Call this number if you have problems the morning of surgery: [phone]   Remember:   Do not eat food or drink liquids after midnight.   Take these medicines the morning of surgery with A SIP OF WATER: percocet if needed, albuterol if needed   Do not wear jewelry.  Do not wear lotions, powders, or perfumes. You may wear deodorant.  Do not shave 48 hours prior to surgery. Men may shave face and neck.  Do not bring valuables to the hospital.  [facility] is not responsible for any belongings or valuables.               Contacts, dentures or bridgework may not be worn into surgery.  Leave suitcase in the car. After surgery it may be brought to your room.  For patients admitted to the hospital, discharge time is determined by your treatment team.               Patients discharged the day of surgery will not be allowed to drive home.  Please read over the following fact sheets that you were given: Pain Booklet, Coughing and Deep Breathing, Blood Transfusion Information, MRSA Information and Surgical Site Infection Prevention [place] - Preparing for Surgery  Before surgery, you can play an important role.  Because skin is not sterile, your skin needs to be as free of germs as possible.  You can reduce the number of germs on you skin by washing with CHG (chlorahexidine gluconate) soap before surgery.  CHG is an antiseptic cleaner which kills germs and bonds with the skin to continue killing germs even after washing.  Please DO NOT use if you have an allergy to CHG or antibacterial soaps.  If your skin becomes reddened/irritated stop using the CHG and inform your nurse when you arrive at Short Stay.  Do not shave (including legs and underarms) for at least 48 hours prior to the first CHG shower.  You may shave your face.  Please follow these  instructions carefully:   1.  Shower with CHG Soap the night before surgery and the morning of Surgery.  2.  If you choose to wash your hair, wash your hair first as usual with your normal shampoo.  3.  After you shampoo, rinse your hair and body thoroughly to remove the shampoo.  4.  Use CHG as you would any other liquid soap.  You can apply CHG directly to the skin and wash gently with scrungie or a clean washcloth.  5.  Apply the CHG Soap to your body ONLY FROM THE NECK DOWN.  Do not use on open wounds or open sores.  Avoid contact with your eyes, ears, mouth and genitals (private parts).  Wash genitals (private parts) with your normal soap.  6.  Wash thoroughly, paying special attention to the area where your surgery will be performed.  7.  Thoroughly rinse your body with warm water from the neck down.  8.  DO NOT shower/wash with your normal soap after using and rinsing off the CHG Soap.  9.  Pat yourself dry with a clean towel.            10.  Wear clean pajamas.            11.  Place clean sheets on your bed the night  of your first shower and do not sleep with pets.  Day of Surgery  Do not apply any lotions/deoderants the morning of surgery.  Please wear clean clothes to the hospital/surgery center.

## 2014-10-01 ENCOUNTER — Encounter (HOSPITAL_COMMUNITY)
Admission: RE | Admit: 2014-10-01 | Discharge: 2014-10-01 | Disposition: A | Discharge status: Home / Self Care | Payer: Medicaid Other | Source: Ambulatory Visit | Attending: Orthopedic Surgery | Admitting: Orthopedic Surgery

## 2014-10-01 ENCOUNTER — Encounter (HOSPITAL_COMMUNITY): Payer: Self-pay

## 2014-10-01 ENCOUNTER — Other Ambulatory Visit: Payer: Self-pay | Admitting: Orthopedic Surgery

## 2014-10-01 DIAGNOSIS — J449 Chronic obstructive pulmonary disease, unspecified: Secondary | ICD-10-CM | POA: Diagnosis not present

## 2014-10-01 DIAGNOSIS — E669 Obesity, unspecified: Secondary | ICD-10-CM | POA: Diagnosis not present

## 2014-10-01 DIAGNOSIS — I10 Essential (primary) hypertension: Secondary | ICD-10-CM | POA: Diagnosis not present

## 2014-10-01 DIAGNOSIS — K852 Alcohol induced acute pancreatitis: Secondary | ICD-10-CM | POA: Insufficient documentation

## 2014-10-01 DIAGNOSIS — F1020 Alcohol dependence, uncomplicated: Secondary | ICD-10-CM | POA: Diagnosis not present

## 2014-10-01 DIAGNOSIS — R0602 Shortness of breath: Secondary | ICD-10-CM | POA: Insufficient documentation

## 2014-10-01 DIAGNOSIS — F329 Major depressive disorder, single episode, unspecified: Secondary | ICD-10-CM | POA: Insufficient documentation

## 2014-10-01 DIAGNOSIS — M87051 Idiopathic aseptic necrosis of right femur: Secondary | ICD-10-CM | POA: Diagnosis not present

## 2014-10-01 DIAGNOSIS — F419 Anxiety disorder, unspecified: Secondary | ICD-10-CM | POA: Diagnosis not present

## 2014-10-01 DIAGNOSIS — Z6832 Body mass index (BMI) 32.0-32.9, adult: Secondary | ICD-10-CM | POA: Diagnosis not present

## 2014-10-01 DIAGNOSIS — K219 Gastro-esophageal reflux disease without esophagitis: Secondary | ICD-10-CM | POA: Diagnosis not present

## 2014-10-01 DIAGNOSIS — F17210 Nicotine dependence, cigarettes, uncomplicated: Secondary | ICD-10-CM | POA: Insufficient documentation

## 2014-10-01 DIAGNOSIS — Z89021 Acquired absence of right finger(s): Secondary | ICD-10-CM | POA: Diagnosis not present

## 2014-10-01 DIAGNOSIS — D696 Thrombocytopenia, unspecified: Secondary | ICD-10-CM | POA: Diagnosis not present

## 2014-10-01 DIAGNOSIS — Z0000 Encounter for general adult medical examination without abnormal findings: Secondary | ICD-10-CM | POA: Diagnosis not present

## 2014-10-01 HISTORY — DX: Unspecified osteoarthritis, unspecified site: M19.90

## 2014-10-01 LAB — TYPE AND SCREEN
ABO/RH(D): O POS
Antibody Screen: NEGATIVE

## 2014-10-01 LAB — CBC WITH DIFFERENTIAL/PLATELET
BASOS PCT: 0 % (ref 0–1)
Basophils Absolute: 0 10*3/uL (ref 0.0–0.1)
EOS ABS: 0.1 10*3/uL (ref 0.0–0.7)
Eosinophils Relative: 2 % (ref 0–5)
HEMATOCRIT: 46.1 % (ref 39.0–52.0)
HEMOGLOBIN: 15.2 g/dL (ref 13.0–17.0)
LYMPHS ABS: 2.1 10*3/uL (ref 0.7–4.0)
Lymphocytes Relative: 29 % (ref 12–46)
MCH: 32.3 pg (ref 26.0–34.0)
MCHC: 33 g/dL (ref 30.0–36.0)
MCV: 97.9 fL (ref 78.0–100.0)
Monocytes Absolute: 1.2 10*3/uL — ABNORMAL HIGH (ref 0.1–1.0)
Monocytes Relative: 16 % — ABNORMAL HIGH (ref 3–12)
NEUTROS ABS: 3.8 10*3/uL (ref 1.7–7.7)
NEUTROS PCT: 53 % (ref 43–77)
Platelets: 75 10*3/uL — ABNORMAL LOW (ref 150–400)
RBC: 4.71 MIL/uL (ref 4.22–5.81)
RDW: 15.8 % — ABNORMAL HIGH (ref 11.5–15.5)
WBC: 7.1 10*3/uL (ref 4.0–10.5)

## 2014-10-01 LAB — URINALYSIS, ROUTINE W REFLEX MICROSCOPIC
Bilirubin Urine: NEGATIVE
Glucose, UA: NEGATIVE mg/dL
KETONES UR: NEGATIVE mg/dL
LEUKOCYTES UA: NEGATIVE
Nitrite: NEGATIVE
PH: 5.5 (ref 5.0–8.0)
PROTEIN: 100 mg/dL — AB
Specific Gravity, Urine: 1.006 (ref 1.005–1.030)
Urobilinogen, UA: 0.2 mg/dL (ref 0.0–1.0)

## 2014-10-01 LAB — SURGICAL PCR SCREEN
MRSA, PCR: NEGATIVE
STAPHYLOCOCCUS AUREUS: NEGATIVE

## 2014-10-01 LAB — PROTIME-INR
INR: 1.22 (ref 0.00–1.49)
Prothrombin Time: 15.4 seconds — ABNORMAL HIGH (ref 11.6–15.2)

## 2014-10-01 LAB — COMPREHENSIVE METABOLIC PANEL
ALK PHOS: 110 U/L (ref 39–117)
ALT: 32 U/L (ref 0–53)
AST: 77 U/L — ABNORMAL HIGH (ref 0–37)
Albumin: 2.5 g/dL — ABNORMAL LOW (ref 3.5–5.2)
Anion gap: 17 — ABNORMAL HIGH (ref 5–15)
BILIRUBIN TOTAL: 0.4 mg/dL (ref 0.3–1.2)
BUN: 7 mg/dL (ref 6–23)
CHLORIDE: 98 meq/L (ref 96–112)
CO2: 20 mEq/L (ref 19–32)
CREATININE: 0.81 mg/dL (ref 0.50–1.35)
Calcium: 8.6 mg/dL (ref 8.4–10.5)
GLUCOSE: 98 mg/dL (ref 70–99)
POTASSIUM: 3.7 meq/L (ref 3.7–5.3)
Sodium: 135 mEq/L — ABNORMAL LOW (ref 137–147)
Total Protein: 8.1 g/dL (ref 6.0–8.3)

## 2014-10-01 LAB — URINE MICROSCOPIC-ADD ON

## 2014-10-01 LAB — APTT: aPTT: 33 seconds (ref 24–37)

## 2014-10-01 NOTE — Progress Notes (Signed)
SPOKE WITH CARLA WHO STATED YES WE SHOULD SIGN BLOOD CONSENT AND DO UAW/ CULT.  CARLA WAS ALSO MADE AWARE THAT PATIENT WAS OUT OF PAIN MEDICATION AND WILL MESSAGE DR. Luiz BlareGRAVES AND LET PATIENT KNOW IF HE NEEDS TO STOP HIS ASPIRIN.

## 2014-10-02 LAB — URINE CULTURE
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
## 2014-10-10 MED ORDER — CEFAZOLIN SODIUM-DEXTROSE 2-3 GM-% IV SOLR: 2.0000 g | INTRAVENOUS | Status: DC

## 2014-10-11 ENCOUNTER — Inpatient Hospital Stay (HOSPITAL_COMMUNITY): Payer: Medicaid Other

## 2014-10-11 ENCOUNTER — Inpatient Hospital Stay (HOSPITAL_COMMUNITY): Payer: Medicaid Other | Admitting: Certified Registered"

## 2014-10-11 ENCOUNTER — Encounter (HOSPITAL_COMMUNITY)
Admission: RE | Disposition: A | Discharge status: Home Health | Payer: Self-pay | Source: Ambulatory Visit | Attending: Orthopedic Surgery

## 2014-10-11 ENCOUNTER — Inpatient Hospital Stay (HOSPITAL_COMMUNITY)
Admission: RE | Admit: 2014-10-11 | Discharge: 2014-10-13 | DRG: 470 | Disposition: A | Discharge status: Home Health | Payer: Medicaid Other | Source: Ambulatory Visit | Attending: Orthopedic Surgery | Admitting: Orthopedic Surgery

## 2014-10-11 ENCOUNTER — Encounter (HOSPITAL_COMMUNITY): Payer: Medicaid Other | Admitting: Vascular Surgery

## 2014-10-11 DIAGNOSIS — K219 Gastro-esophageal reflux disease without esophagitis: Secondary | ICD-10-CM | POA: Diagnosis present

## 2014-10-11 DIAGNOSIS — F1020 Alcohol dependence, uncomplicated: Secondary | ICD-10-CM | POA: Diagnosis present

## 2014-10-11 DIAGNOSIS — F17210 Nicotine dependence, cigarettes, uncomplicated: Secondary | ICD-10-CM | POA: Diagnosis present

## 2014-10-11 DIAGNOSIS — M1612 Unilateral primary osteoarthritis, left hip: Secondary | ICD-10-CM | POA: Diagnosis present

## 2014-10-11 DIAGNOSIS — I10 Essential (primary) hypertension: Secondary | ICD-10-CM | POA: Diagnosis present

## 2014-10-11 DIAGNOSIS — Z7982 Long term (current) use of aspirin: Secondary | ICD-10-CM | POA: Diagnosis not present

## 2014-10-11 DIAGNOSIS — M879 Osteonecrosis, unspecified: Secondary | ICD-10-CM | POA: Diagnosis present

## 2014-10-11 DIAGNOSIS — Z8249 Family history of ischemic heart disease and other diseases of the circulatory system: Secondary | ICD-10-CM

## 2014-10-11 DIAGNOSIS — Z96641 Presence of right artificial hip joint: Secondary | ICD-10-CM | POA: Diagnosis present

## 2014-10-11 DIAGNOSIS — J449 Chronic obstructive pulmonary disease, unspecified: Secondary | ICD-10-CM | POA: Diagnosis present

## 2014-10-11 DIAGNOSIS — M25552 Pain in left hip: Secondary | ICD-10-CM | POA: Diagnosis present

## 2014-10-11 DIAGNOSIS — M87052 Idiopathic aseptic necrosis of left femur: Secondary | ICD-10-CM

## 2014-10-11 DIAGNOSIS — Z720 Tobacco use: Secondary | ICD-10-CM | POA: Diagnosis not present

## 2014-10-11 HISTORY — PX: TOTAL HIP ARTHROPLASTY: SHX124

## 2014-10-11 SURGERY — ARTHROPLASTY, HIP, TOTAL, ANTERIOR APPROACH
Anesthesia: General | Site: Hip | Laterality: Left

## 2014-10-11 MED ORDER — ALBUTEROL SULFATE (2.5 MG/3ML) 0.083% IN NEBU
INHALATION_SOLUTION | RESPIRATORY_TRACT | Status: AC
  Filled 2014-10-11: qty 3

## 2014-10-11 MED ORDER — GLYCOPYRROLATE 0.2 MG/ML IJ SOLN
INTRAMUSCULAR | Status: AC
  Filled 2014-10-11: qty 3

## 2014-10-11 MED ORDER — SPIRITUS FRUMENTI
1.0000 | Freq: Every day | ORAL | Status: DC
  Administered 2014-10-12: 1 via ORAL
  Filled 2014-10-11 (×6): qty 1

## 2014-10-11 MED ORDER — PROPOFOL 10 MG/ML IV BOLUS
INTRAVENOUS | Status: AC
  Filled 2014-10-11: qty 20

## 2014-10-11 MED ORDER — FENTANYL CITRATE 0.05 MG/ML IJ SOLN
INTRAMUSCULAR | Status: AC
  Filled 2014-10-11: qty 5

## 2014-10-11 MED ORDER — ASPIRIN EC 325 MG PO TBEC
325.0000 mg | DELAYED_RELEASE_TABLET | Freq: Two times a day (BID) | ORAL | Status: DC
  Administered 2014-10-12 – 2014-10-13 (×3): 325 mg via ORAL
  Filled 2014-10-11 (×5): qty 1

## 2014-10-11 MED ORDER — ROCURONIUM BROMIDE 50 MG/5ML IV SOLN
INTRAVENOUS | Status: AC
  Filled 2014-10-11: qty 1

## 2014-10-11 MED ORDER — LACTATED RINGERS IV SOLN
INTRAVENOUS | Status: DC
  Administered 2014-10-11: 13:00:00 via INTRAVENOUS

## 2014-10-11 MED ORDER — HYDROMORPHONE HCL 1 MG/ML IJ SOLN
0.2500 mg | INTRAMUSCULAR | Status: DC | PRN
  Administered 2014-10-11 (×2): 0.5 mg via INTRAVENOUS

## 2014-10-11 MED ORDER — VITAMIN B-1 100 MG PO TABS
100.0000 mg | ORAL_TABLET | Freq: Every day | ORAL | Status: DC
  Administered 2014-10-12 – 2014-10-13 (×2): 100 mg via ORAL
  Filled 2014-10-11 (×4): qty 1

## 2014-10-11 MED ORDER — METHOCARBAMOL 1000 MG/10ML IJ SOLN
500.0000 mg | Freq: Four times a day (QID) | INTRAVENOUS | Status: DC | PRN
  Filled 2014-10-11: qty 5

## 2014-10-11 MED ORDER — LACTATED RINGERS IV SOLN
INTRAVENOUS | Status: DC | PRN
  Administered 2014-10-11 (×3): via INTRAVENOUS

## 2014-10-11 MED ORDER — GLYCOPYRROLATE 0.2 MG/ML IJ SOLN
INTRAMUSCULAR | Status: DC | PRN
  Administered 2014-10-11: 0.6 mg via INTRAVENOUS

## 2014-10-11 MED ORDER — ONDANSETRON HCL 4 MG/2ML IJ SOLN
INTRAMUSCULAR | Status: AC
  Filled 2014-10-11: qty 2

## 2014-10-11 MED ORDER — ONDANSETRON HCL 4 MG/2ML IJ SOLN
INTRAMUSCULAR | Status: DC | PRN
  Administered 2014-10-11: 4 mg via INTRAVENOUS

## 2014-10-11 MED ORDER — BUPIVACAINE HCL 0.5 % IJ SOLN
INTRAMUSCULAR | Status: AC
  Filled 2014-10-11: qty 1

## 2014-10-11 MED ORDER — OXYCODONE HCL 5 MG PO TABS: 5.0000 mg | ORAL_TABLET | Freq: Once | ORAL | Status: DC | PRN

## 2014-10-11 MED ORDER — ACETAMINOPHEN 325 MG PO TABS: 650.0000 mg | ORAL_TABLET | Freq: Four times a day (QID) | ORAL | Status: DC | PRN

## 2014-10-11 MED ORDER — ARTIFICIAL TEARS OP OINT
TOPICAL_OINTMENT | OPHTHALMIC | Status: AC
  Filled 2014-10-11: qty 3.5

## 2014-10-11 MED ORDER — CEFAZOLIN SODIUM-DEXTROSE 2-3 GM-% IV SOLR
2.0000 g | INTRAVENOUS | Status: DC
Start: 2014-10-11 — End: 2014-10-11

## 2014-10-11 MED ORDER — MIDAZOLAM HCL 5 MG/5ML IJ SOLN
INTRAMUSCULAR | Status: DC | PRN
  Administered 2014-10-11: 2 mg via INTRAVENOUS

## 2014-10-11 MED ORDER — LIDOCAINE HCL (CARDIAC) 20 MG/ML IV SOLN
INTRAVENOUS | Status: DC | PRN
  Administered 2014-10-11: 100 mg via INTRAVENOUS

## 2014-10-11 MED ORDER — DIPHENHYDRAMINE HCL 12.5 MG/5ML PO ELIX: 12.5000 mg | ORAL_SOLUTION | ORAL | Status: DC | PRN

## 2014-10-11 MED ORDER — DIPHENHYDRAMINE HCL 50 MG/ML IJ SOLN
INTRAMUSCULAR | Status: DC | PRN
  Administered 2014-10-11: 12.5 mg via INTRAVENOUS

## 2014-10-11 MED ORDER — TRANEXAMIC ACID 100 MG/ML IV SOLN
1000.0000 mg | INTRAVENOUS | Status: AC
  Administered 2014-10-11: 1000 mg via INTRAVENOUS
  Filled 2014-10-11: qty 10

## 2014-10-11 MED ORDER — BISACODYL 5 MG PO TBEC: 5.0000 mg | DELAYED_RELEASE_TABLET | Freq: Every day | ORAL | Status: DC | PRN

## 2014-10-11 MED ORDER — SODIUM CHLORIDE 0.9 % IJ SOLN
INTRAMUSCULAR | Status: AC
  Filled 2014-10-11: qty 10

## 2014-10-11 MED ORDER — CEFAZOLIN SODIUM-DEXTROSE 2-3 GM-% IV SOLR
2.0000 g | Freq: Four times a day (QID) | INTRAVENOUS | Status: AC
  Administered 2014-10-11 – 2014-10-12 (×2): 2 g via INTRAVENOUS
  Filled 2014-10-11 (×2): qty 50

## 2014-10-11 MED ORDER — POLYETHYLENE GLYCOL 3350 17 G PO PACK: 17.0000 g | PACK | Freq: Every day | ORAL | Status: DC | PRN

## 2014-10-11 MED ORDER — MIDAZOLAM HCL 2 MG/2ML IJ SOLN
INTRAMUSCULAR | Status: AC
  Filled 2014-10-11: qty 2

## 2014-10-11 MED ORDER — OXYCODONE HCL 5 MG PO TABS
5.0000 mg | ORAL_TABLET | ORAL | Status: DC | PRN
  Administered 2014-10-11 – 2014-10-13 (×7): 10 mg via ORAL
  Filled 2014-10-11 (×7): qty 2

## 2014-10-11 MED ORDER — HYDROMORPHONE HCL 1 MG/ML IJ SOLN
INTRAMUSCULAR | Status: AC
  Filled 2014-10-11: qty 1

## 2014-10-11 MED ORDER — ALBUTEROL SULFATE (2.5 MG/3ML) 0.083% IN NEBU: 3.0000 mL | INHALATION_SOLUTION | Freq: Four times a day (QID) | RESPIRATORY_TRACT | Status: DC | PRN

## 2014-10-11 MED ORDER — DEXAMETHASONE SODIUM PHOSPHATE 4 MG/ML IJ SOLN
INTRAMUSCULAR | Status: DC | PRN
  Administered 2014-10-11: 8 mg via INTRAVENOUS

## 2014-10-11 MED ORDER — ALBUTEROL SULFATE (2.5 MG/3ML) 0.083% IN NEBU
2.5000 mg | INHALATION_SOLUTION | Freq: Once | RESPIRATORY_TRACT | Status: AC
  Administered 2014-10-11: 2.5 mg via RESPIRATORY_TRACT

## 2014-10-11 MED ORDER — ASPIRIN EC 325 MG PO TBEC: 325.0000 mg | DELAYED_RELEASE_TABLET | Freq: Two times a day (BID) | ORAL | Status: DC

## 2014-10-11 MED ORDER — EPHEDRINE SULFATE 50 MG/ML IJ SOLN
INTRAMUSCULAR | Status: AC
  Filled 2014-10-11: qty 1

## 2014-10-11 MED ORDER — 0.9 % SODIUM CHLORIDE (POUR BTL) OPTIME
TOPICAL | Status: DC | PRN
  Administered 2014-10-11: 1000 mL

## 2014-10-11 MED ORDER — FOLIC ACID 1 MG PO TABS
1.0000 mg | ORAL_TABLET | Freq: Every day | ORAL | Status: DC
  Administered 2014-10-11 – 2014-10-13 (×3): 1 mg via ORAL
  Filled 2014-10-11 (×4): qty 1

## 2014-10-11 MED ORDER — POVIDONE-IODINE 7.5 % EX SOLN
Freq: Once | CUTANEOUS | Status: DC
  Filled 2014-10-11: qty 118

## 2014-10-11 MED ORDER — LORAZEPAM 2 MG/ML IJ SOLN: 1.0000 mg | Freq: Four times a day (QID) | INTRAMUSCULAR | Status: DC | PRN

## 2014-10-11 MED ORDER — NEOSTIGMINE METHYLSULFATE 10 MG/10ML IV SOLN
INTRAVENOUS | Status: AC
  Filled 2014-10-11: qty 1

## 2014-10-11 MED ORDER — CEFAZOLIN SODIUM-DEXTROSE 2-3 GM-% IV SOLR: 2.0000 g | INTRAVENOUS | Status: DC

## 2014-10-11 MED ORDER — ONDANSETRON HCL 4 MG/2ML IJ SOLN
4.0000 mg | Freq: Four times a day (QID) | INTRAMUSCULAR | Status: DC | PRN
Start: 2014-10-11 — End: 2014-10-13

## 2014-10-11 MED ORDER — DOCUSATE SODIUM 100 MG PO CAPS
100.0000 mg | ORAL_CAPSULE | Freq: Two times a day (BID) | ORAL | Status: DC
  Administered 2014-10-11 – 2014-10-13 (×4): 100 mg via ORAL
  Filled 2014-10-11 (×5): qty 1

## 2014-10-11 MED ORDER — HYDROMORPHONE HCL 1 MG/ML IJ SOLN
1.0000 mg | INTRAMUSCULAR | Status: DC | PRN
  Administered 2014-10-11 – 2014-10-12 (×5): 2 mg via INTRAVENOUS
  Filled 2014-10-11 (×5): qty 2

## 2014-10-11 MED ORDER — OXYCODONE HCL 5 MG PO TABS: 5.0000 mg | ORAL_TABLET | Freq: Four times a day (QID) | ORAL | Status: DC | PRN

## 2014-10-11 MED ORDER — ALUM & MAG HYDROXIDE-SIMETH 200-200-20 MG/5ML PO SUSP
30.0000 mL | ORAL | Status: DC | PRN
Start: 2014-10-11 — End: 2014-10-13

## 2014-10-11 MED ORDER — METHOCARBAMOL 750 MG PO TABS: 750.0000 mg | ORAL_TABLET | Freq: Three times a day (TID) | ORAL | Status: DC | PRN

## 2014-10-11 MED ORDER — STERILE WATER FOR INJECTION IJ SOLN
INTRAMUSCULAR | Status: AC
  Filled 2014-10-11: qty 10

## 2014-10-11 MED ORDER — ROCURONIUM BROMIDE 100 MG/10ML IV SOLN
INTRAVENOUS | Status: DC | PRN
  Administered 2014-10-11: 10 mg via INTRAVENOUS
  Administered 2014-10-11 (×3): 20 mg via INTRAVENOUS
  Administered 2014-10-11: 50 mg via INTRAVENOUS
  Administered 2014-10-11: 20 mg via INTRAVENOUS

## 2014-10-11 MED ORDER — ACETAMINOPHEN 650 MG RE SUPP: 650.0000 mg | Freq: Four times a day (QID) | RECTAL | Status: DC | PRN

## 2014-10-11 MED ORDER — LORAZEPAM 1 MG PO TABS
1.0000 mg | ORAL_TABLET | Freq: Four times a day (QID) | ORAL | Status: DC | PRN
  Administered 2014-10-11 – 2014-10-13 (×7): 1 mg via ORAL
  Filled 2014-10-11 (×7): qty 1

## 2014-10-11 MED ORDER — OXYCODONE HCL 5 MG/5ML PO SOLN: 5.0000 mg | Freq: Once | ORAL | Status: DC | PRN

## 2014-10-11 MED ORDER — ADULT MULTIVITAMIN W/MINERALS CH
1.0000 | ORAL_TABLET | Freq: Every day | ORAL | Status: DC
  Administered 2014-10-11 – 2014-10-13 (×3): 1 via ORAL
  Filled 2014-10-11 (×4): qty 1

## 2014-10-11 MED ORDER — PROMETHAZINE HCL 25 MG/ML IJ SOLN: 6.2500 mg | INTRAMUSCULAR | Status: DC | PRN

## 2014-10-11 MED ORDER — LIDOCAINE HCL (CARDIAC) 20 MG/ML IV SOLN
INTRAVENOUS | Status: AC
  Filled 2014-10-11: qty 5

## 2014-10-11 MED ORDER — KETOROLAC TROMETHAMINE 15 MG/ML IJ SOLN
15.0000 mg | Freq: Three times a day (TID) | INTRAMUSCULAR | Status: AC
  Administered 2014-10-11 – 2014-10-12 (×4): 15 mg via INTRAVENOUS
  Filled 2014-10-11 (×4): qty 1

## 2014-10-11 MED ORDER — POVIDONE-IODINE 7.5 % EX SOLN: Freq: Once | CUTANEOUS | Status: DC

## 2014-10-11 MED ORDER — SUCCINYLCHOLINE CHLORIDE 20 MG/ML IJ SOLN
INTRAMUSCULAR | Status: AC
  Filled 2014-10-11: qty 1

## 2014-10-11 MED ORDER — LORAZEPAM 2 MG/ML IJ SOLN
0.0000 mg | Freq: Four times a day (QID) | INTRAMUSCULAR | Status: DC
  Administered 2014-10-12 (×2): 2 mg via INTRAVENOUS
  Administered 2014-10-12: 1 mg via INTRAVENOUS
  Administered 2014-10-12 (×2): 2 mg via INTRAVENOUS
  Filled 2014-10-11 (×5): qty 1

## 2014-10-11 MED ORDER — BUPIVACAINE LIPOSOME 1.3 % IJ SUSP
20.0000 mL | Freq: Once | INTRAMUSCULAR | Status: AC
  Administered 2014-10-11: 20 mL
  Filled 2014-10-11: qty 20

## 2014-10-11 MED ORDER — ALBUTEROL SULFATE (2.5 MG/3ML) 0.083% IN NEBU: 2.5000 mg | INHALATION_SOLUTION | Freq: Four times a day (QID) | RESPIRATORY_TRACT | Status: DC | PRN

## 2014-10-11 MED ORDER — FENTANYL CITRATE 0.05 MG/ML IJ SOLN
INTRAMUSCULAR | Status: DC | PRN
  Administered 2014-10-11: 100 ug via INTRAVENOUS
  Administered 2014-10-11 (×5): 50 ug via INTRAVENOUS
  Administered 2014-10-11 (×2): 100 ug via INTRAVENOUS
  Administered 2014-10-11 (×2): 50 ug via INTRAVENOUS

## 2014-10-11 MED ORDER — DIPHENHYDRAMINE HCL 50 MG/ML IJ SOLN
INTRAMUSCULAR | Status: AC
  Filled 2014-10-11: qty 1

## 2014-10-11 MED ORDER — LORAZEPAM 2 MG/ML IJ SOLN: 0.0000 mg | Freq: Two times a day (BID) | INTRAMUSCULAR | Status: DC

## 2014-10-11 MED ORDER — THIAMINE HCL 100 MG/ML IJ SOLN
100.0000 mg | Freq: Every day | INTRAMUSCULAR | Status: DC
  Administered 2014-10-11: 100 mg via INTRAVENOUS
  Filled 2014-10-11 (×3): qty 1

## 2014-10-11 MED ORDER — SODIUM CHLORIDE 0.9 % IV SOLN
INTRAVENOUS | Status: DC
  Administered 2014-10-12: 09:00:00 via INTRAVENOUS

## 2014-10-11 MED ORDER — ZOLPIDEM TARTRATE 5 MG PO TABS: 5.0000 mg | ORAL_TABLET | Freq: Every evening | ORAL | Status: DC | PRN

## 2014-10-11 MED ORDER — BUPIVACAINE HCL (PF) 0.5 % IJ SOLN
INTRAMUSCULAR | Status: DC | PRN
  Administered 2014-10-11: 20 mL

## 2014-10-11 MED ORDER — PROMETHAZINE HCL 25 MG/ML IJ SOLN: 12.5000 mg | Freq: Four times a day (QID) | INTRAMUSCULAR | Status: DC | PRN

## 2014-10-11 MED ORDER — ALBUMIN HUMAN 5 % IV SOLN
INTRAVENOUS | Status: DC | PRN
  Administered 2014-10-11 (×2): via INTRAVENOUS

## 2014-10-11 MED ORDER — PROPOFOL 10 MG/ML IV BOLUS
INTRAVENOUS | Status: DC | PRN
  Administered 2014-10-11: 200 mg via INTRAVENOUS

## 2014-10-11 MED ORDER — NEOSTIGMINE METHYLSULFATE 10 MG/10ML IV SOLN
INTRAVENOUS | Status: DC | PRN
  Administered 2014-10-11: 5 mg via INTRAVENOUS

## 2014-10-11 MED ORDER — CEFAZOLIN SODIUM-DEXTROSE 2-3 GM-% IV SOLR
INTRAVENOUS | Status: AC
  Administered 2014-10-11: 2 g via INTRAVENOUS
  Filled 2014-10-11: qty 50

## 2014-10-11 MED ORDER — METHOCARBAMOL 500 MG PO TABS
500.0000 mg | ORAL_TABLET | Freq: Four times a day (QID) | ORAL | Status: DC | PRN
  Administered 2014-10-11 – 2014-10-13 (×4): 500 mg via ORAL
  Filled 2014-10-11 (×4): qty 1

## 2014-10-11 MED ORDER — ONDANSETRON HCL 4 MG PO TABS: 4.0000 mg | ORAL_TABLET | Freq: Four times a day (QID) | ORAL | Status: DC | PRN

## 2014-10-11 SURGICAL SUPPLY — 58 items
APL SKNCLS STERI-STRIP NONHPOA (GAUZE/BANDAGES/DRESSINGS) ×1
BENZOIN TINCTURE PRP APPL 2/3 (GAUZE/BANDAGES/DRESSINGS) ×3 IMPLANT
BLADE SAW SGTL 18X1.27X75 (BLADE) ×2 IMPLANT
BLADE SAW SGTL 18X1.27X75MM (BLADE) ×1
BLADE SURG ROTATE 9660 (MISCELLANEOUS) IMPLANT
BNDG COHESIVE 6X5 TAN STRL LF (GAUZE/BANDAGES/DRESSINGS) IMPLANT
BNDG GAUZE ELAST 4 BULKY (GAUZE/BANDAGES/DRESSINGS) IMPLANT
CAPT HIP PF COP ×3 IMPLANT
CELLS DAT CNTRL 66122 CELL SVR (MISCELLANEOUS) ×1 IMPLANT
CLOSURE STERI-STRIP 1/2X4 (GAUZE/BANDAGES/DRESSINGS) ×1
CLSR STERI-STRIP ANTIMIC 1/2X4 (GAUZE/BANDAGES/DRESSINGS) ×2 IMPLANT
COVER SURGICAL LIGHT HANDLE (MISCELLANEOUS) ×3 IMPLANT
DRAPE C-ARM 42X72 X-RAY (DRAPES) ×3 IMPLANT
DRAPE STERI IOBAN 125X83 (DRAPES) ×3 IMPLANT
DRAPE U-SHAPE 47X51 STRL (DRAPES) ×9 IMPLANT
DRSG MEPILEX BORDER 4X8 (GAUZE/BANDAGES/DRESSINGS) ×3 IMPLANT
DURAPREP 26ML APPLICATOR (WOUND CARE) ×3 IMPLANT
ELECT BLADE 4.0 EZ CLEAN MEGAD (MISCELLANEOUS) ×3
ELECT CAUTERY BLADE 6.4 (BLADE) ×3 IMPLANT
ELECT REM PT RETURN 9FT ADLT (ELECTROSURGICAL) ×3
ELECTRODE BLDE 4.0 EZ CLN MEGD (MISCELLANEOUS) ×1 IMPLANT
ELECTRODE REM PT RTRN 9FT ADLT (ELECTROSURGICAL) ×1 IMPLANT
GAUZE XEROFORM 1X8 LF (GAUZE/BANDAGES/DRESSINGS) IMPLANT
GLOVE BIO SURGEON STRL SZ7 (GLOVE) ×3 IMPLANT
GLOVE BIOGEL PI IND STRL 6.5 (GLOVE) ×2 IMPLANT
GLOVE BIOGEL PI IND STRL 7.0 (GLOVE) ×1 IMPLANT
GLOVE BIOGEL PI IND STRL 8 (GLOVE) ×2 IMPLANT
GLOVE BIOGEL PI INDICATOR 6.5 (GLOVE) ×4
GLOVE BIOGEL PI INDICATOR 7.0 (GLOVE) ×2
GLOVE BIOGEL PI INDICATOR 8 (GLOVE) ×4
GLOVE ECLIPSE 6.5 STRL STRAW (GLOVE) ×3 IMPLANT
GLOVE ECLIPSE 7.5 STRL STRAW (GLOVE) ×6 IMPLANT
GOWN STRL REUS W/ TWL LRG LVL3 (GOWN DISPOSABLE) ×2 IMPLANT
GOWN STRL REUS W/ TWL XL LVL3 (GOWN DISPOSABLE) ×2 IMPLANT
GOWN STRL REUS W/TWL LRG LVL3 (GOWN DISPOSABLE) ×6
GOWN STRL REUS W/TWL XL LVL3 (GOWN DISPOSABLE) ×4
HOOD PEEL AWAY FACE SHEILD DIS (HOOD) ×6 IMPLANT
KIT BASIN OR (CUSTOM PROCEDURE TRAY) ×3 IMPLANT
KIT ROOM TURNOVER OR (KITS) ×3 IMPLANT
MANIFOLD NEPTUNE II (INSTRUMENTS) IMPLANT
NS IRRIG 1000ML POUR BTL (IV SOLUTION) ×3 IMPLANT
PACK TOTAL JOINT (CUSTOM PROCEDURE TRAY) ×3 IMPLANT
PAD ARMBOARD 7.5X6 YLW CONV (MISCELLANEOUS) ×6 IMPLANT
RTRCTR WOUND ALEXIS 18CM MED (MISCELLANEOUS) ×3
SPONGE LAP 18X18 X RAY DECT (DISPOSABLE) IMPLANT
STAPLER VISISTAT 35W (STAPLE) IMPLANT
SUT ETHIBOND NAB CT1 #1 30IN (SUTURE) ×6 IMPLANT
SUT MNCRL AB 3-0 PS2 18 (SUTURE) IMPLANT
SUT VIC AB 0 CT1 27 (SUTURE) ×3
SUT VIC AB 0 CT1 27XBRD ANBCTR (SUTURE) ×1 IMPLANT
SUT VIC AB 1 CT1 27 (SUTURE) ×12
SUT VIC AB 1 CT1 27XBRD ANBCTR (SUTURE) ×4 IMPLANT
SUT VIC AB 2-0 CT1 27 (SUTURE) ×3
SUT VIC AB 2-0 CT1 TAPERPNT 27 (SUTURE) ×1 IMPLANT
TOWEL OR 17X24 6PK STRL BLUE (TOWEL DISPOSABLE) ×3 IMPLANT
TOWEL OR 17X26 10 PK STRL BLUE (TOWEL DISPOSABLE) ×3 IMPLANT
TRAY FOLEY CATH 16FR SILVER (SET/KITS/TRAYS/PACK) IMPLANT
WATER STERILE IRR 1000ML POUR (IV SOLUTION) ×6 IMPLANT
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
## 2014-10-11 NOTE — Anesthesia Procedure Notes (Signed)
Procedure Name: Intubation Date/Time: 10/11/2014 2:41 PM Performed by: Brien MatesMAHONY, Kohei Antonellis D Pre-anesthesia Checklist: Patient identified, Emergency Drugs available, Suction available, Patient being monitored and Timeout performed Patient Re-evaluated:Patient Re-evaluated prior to inductionOxygen Delivery Method: Circle system utilized Preoxygenation: Pre-oxygenation with 100% oxygen Intubation Type: IV induction Ventilation: Mask ventilation without difficulty and Oral airway inserted - appropriate to patient size Laryngoscope Size: Miller and 2 Grade View: Grade I Tube type: Oral

## 2014-10-11 NOTE — Brief Op Note (Addendum)
10/11/2014  5:28 PM  PATIENT:  Ruben Sutton  38 y.o. male  PRE-OPERATIVE DIAGNOSIS:  AVN left hip  POST-OPERATIVE DIAGNOSIS:  AVN left hip  PROCEDURE:  Procedure(s) with comments: TOTAL HIP ARTHROPLASTY ANTERIOR APPROACH (Left) - Left anterior total hip replacement  SURGEON:  Surgeon(s) and Role:    * Harvie JuniorJohn L Nnamdi Dacus, MD - Primary  PHYSICIAN ASSISTANT:   ASSISTANTS: bethune   ANESTHESIA:   general  EBL:  Total I/O In: 2500 [I.V.:2000; IV Piggyback:500] Out: 700 [Blood:700]  BLOOD ADMINISTERED:none  DRAINS: none   LOCAL MEDICATIONS USED:  MARCAINE     SPECIMEN:  No Specimen  DISPOSITION OF SPECIMEN:  N/A  COUNTS:  YES  TOURNIQUET:  * No tourniquets in log *  DICTATION: .Other Dictation: Dictation Number [id number]  PLAN OF CARE: Admit to inpatient   PATIENT DISPOSITION:  PACU - hemodynamically stable.   Delay start of Pharmacological VTE agent (>24hrs) due to surgical blood loss or risk of bleeding: no

## 2014-10-11 NOTE — Transfer of Care (Signed)
Immediate Anesthesia Transfer of Care Note  Patient: Ruben Sutton  Procedure(s) Performed: Procedure(s) with comments: TOTAL HIP ARTHROPLASTY ANTERIOR APPROACH (Left) - Left anterior total hip replacement  Patient Location: PACU  Anesthesia Type:General  Level of Consciousness: awake  Airway & Oxygen Therapy: Patient Spontanous Breathing and Patient connected to face mask oxygen  Post-op Assessment: Report given to PACU RN and Post -op Vital signs reviewed and stable  Post vital signs: Reviewed and stable  Complications: No apparent anesthesia complications

## 2014-10-11 NOTE — Progress Notes (Signed)
Dr. Krista BlueSinger & Luiz BlareGraves aware of plt. Count. Dr. Krista BlueSinger aware of pt. Admitting that he was drinking water up to 0600 today.

## 2014-10-11 NOTE — Discharge Instructions (Signed)
Total Hip Replacement, Care After °Refer to this sheet in the next few weeks. These instructions provide you with information on caring for yourself after your procedure. Your health care provider may also give you specific instructions. Your treatment has been planned according to the most current medical practices, but problems sometimes occur. Call your health care provider if you have any problems or questions after your procedure. °HOME CARE INSTRUCTIONS  °Your health care provider will give you specific precautions for certain types of movement. Additional instructions include: °· Take medicines only as directed by your health care provider. °· Take quick showers (3-5 min) rather than bathe until your health care provider tells you that you can take baths again. °· Avoid lifting until your health care provider instructs you otherwise. °· Use a raised toilet seat and avoid sitting in low chairs as instructed by your health care provider. °· Use crutches or a Idler as instructed by your health care provider. °SEEK MEDICAL CARE IF: °· You have difficulty breathing. °· You have drainage, redness, or swelling at your incision site. °· You have a bad smell coming from your incision site. °· You have persistent bleeding from your incision site. °· Your incision breaks open after sutures (stitches) or staples have been removed. °· You have a fever. °SEEK IMMEDIATE MEDICAL CARE IF:  °· You have a rash. °· You have pain or swelling in your calf or thigh. °· You have shortness of breath or chest pain. °MAKE SURE YOU: °· Understand these instructions. °· Will watch your condition. °· Will get help if you are not doing well or get worse. °Document Released: 06/29/2005 Document Revised: 04/26/2014 Document Reviewed: 02/10/2014 °ExitCare® Patient Information ©2015 [employer]. This information is not intended to replace advice given to you by your health care provider. Make sure you discuss any questions you have with  your health care provider. ° °

## 2014-10-11 NOTE — Anesthesia Preprocedure Evaluation (Addendum)
Anesthesia Evaluation  Patient identified by MRN, date of birth, ID band Patient awake    Reviewed: Allergy & Precautions, H&P , NPO status , Patient's Chart, lab work & pertinent test results  History of Anesthesia Complications Negative for: history of anesthetic complications  Airway Mallampati: II TM Distance: >3 FB Neck ROM: Full    Dental  (+) Teeth Intact, Dental Advisory Given   Pulmonary COPD COPD inhaler, Current Smoker,    Pulmonary exam normal       Cardiovascular hypertension,     Neuro/Psych  Headaches, PSYCHIATRIC DISORDERS Anxiety Depression    GI/Hepatic GERD-  ,(+)     substance abuse  alcohol use,   Endo/Other    Renal/GU negative Renal ROS     Musculoskeletal   Abdominal   Peds  Hematology   Anesthesia Other Findings   Reproductive/Obstetrics                        Anesthesia Physical Anesthesia Plan  ASA: III  Anesthesia Plan: General   Post-op Pain Management:    Induction: Intravenous  Airway Management Planned: Oral ETT  Additional Equipment:   Intra-op Plan:   Post-operative Plan: Extubation in OR  Informed Consent: I have reviewed the patients History and Physical, chart, labs and discussed the procedure including the risks, benefits and alternatives for the proposed anesthesia with the patient or authorized representative who has indicated his/her understanding and acceptance.   Dental advisory given  Plan Discussed with: CRNA, Anesthesiologist and Surgeon  Anesthesia Plan Comments:        Anesthesia Quick Evaluation

## 2014-10-12 ENCOUNTER — Encounter (HOSPITAL_COMMUNITY): Payer: Self-pay | Admitting: Orthopedic Surgery

## 2014-10-12 LAB — BASIC METABOLIC PANEL
Anion gap: 13 (ref 5–15)
BUN: 6 mg/dL (ref 6–23)
CO2: 29 mEq/L (ref 19–32)
Calcium: 8.3 mg/dL — ABNORMAL LOW (ref 8.4–10.5)
Chloride: 92 mEq/L — ABNORMAL LOW (ref 96–112)
Creatinine, Ser: 0.65 mg/dL (ref 0.50–1.35)
GFR calc non Af Amer: >90 mL/min (ref >90)
Glucose, Bld: 175 mg/dL — ABNORMAL HIGH (ref 70–99)
POTASSIUM: 4.5 meq/L (ref 3.7–5.3)
SODIUM: 134 meq/L — AB (ref 137–147)

## 2014-10-12 LAB — CBC
HCT: 39.6 % (ref 39.0–52.0)
Hemoglobin: 12.7 g/dL — ABNORMAL LOW (ref 13.0–17.0)
MCH: 32.2 pg (ref 26.0–34.0)
MCHC: 32.1 g/dL (ref 30.0–36.0)
MCV: 100.3 fL — ABNORMAL HIGH (ref 78.0–100.0)
Platelets: 98 10*3/uL — ABNORMAL LOW (ref 150–400)
RBC: 3.95 MIL/uL — ABNORMAL LOW (ref 4.22–5.81)
RDW: 15.9 % — ABNORMAL HIGH (ref 11.5–15.5)
WBC: 7.4 10*3/uL (ref 4.0–10.5)

## 2014-10-12 NOTE — Progress Notes (Signed)
Subjective: 1 Day Post-Op Procedure(s) (LRB): TOTAL HIP ARTHROPLASTY ANTERIOR APPROACH (Left) Patient reports pain as moderate.  Not OOB yet.  Taking po/voiding ok.  Objective: Vital signs in last 24 hours: Temp:  [97.8 F (36.6 C)-98.5 F (36.9 C)] 97.8 F (36.6 C) (10/20 0556) Pulse Rate:  [89-132] 89 (10/20 0556) Resp:  [16-23] 16 (10/19 1925) BP: (149-184)/(83-105) 170/83 mmHg (10/20 0556) SpO2:  [90 %-95 %] 93 % (10/20 0556) Weight:  [112.946 kg (249 lb)] 112.946 kg (249 lb) (10/19 1138)  Intake/Output from previous day: 10/19 0701 - 10/20 0700 In: 4120 [P.O.:120; I.V.:3500; IV Piggyback:500] Out: 1650 [Urine:950; Blood:700] Intake/Output this shift: Total I/O In: 240 [P.O.:240] Out: -    Recent Labs  10/12/14 0547  HGB 12.7*    Recent Labs  10/12/14 0547  WBC 7.4  RBC 3.95*  HCT 39.6  PLT 98*    Recent Labs  10/12/14 0547  NA 134*  K 4.5  CL 92*  CO2 29  BUN 6  CREATININE 0.65  GLUCOSE 175*  CALCIUM 8.3*   Left hip exam: Neurovascular intact Sensation intact distally Intact pulses distally Dorsiflexion/Plantar flexion intact Incision: dressing C/D/I Compartment soft  Assessment/Plan: 1 Day Post-Op Procedure(s) (LRB): TOTAL HIP ARTHROPLASTY ANTERIOR APPROACH (Left) Plan: Up with PT today ASA/SCD's for dvt prophylaxis. Plan for discharge tomorrow  Ruben Sutton,Ruben Sutton 10/12/2014, 9:43 AM

## 2014-10-12 NOTE — Progress Notes (Signed)
PT Cancellation Note  Patient Details Name: Ruben BaileyRoger R Dejoseph MRN: [id number] DOB: 07/23/1976   Cancelled Treatment:    Reason Eval/Treat Not Completed: Pain limiting ability to participate (Nursing was told by PT that pt wouldn't do therapy  til 2PM ) when his medication dose would be available again, although PT arrived just after 11 AM.   Ivar DrapeStout, Hawkins Seaman E 10/12/2014, 1:25 PM Samul Dadauth Sopheap Boehle, PT MS Acute Rehab Dept. Number: [phone]

## 2014-10-12 NOTE — Progress Notes (Signed)
Utilization review completed.  

## 2014-10-12 NOTE — Evaluation (Signed)
Physical Therapy Evaluation Patient Details Name: Ruben BaileyRoger R Sutton MRN: [id number] DOB: 11/20/1976 Today's Date: 10/12/2014   History of Present Illness  38 yo male with new L THA with no precautions, has history of avascular necrosis and COPD, chest pain and EtOH abuse.  Clinical Impression  Pt was seen for evaluation with his significant other there to observe and listen to his instructions.  Limited his gait today due to pain complaints with meds not having been delivered yet.  Will benefit from stairs to manage home safely tomorrow.    Follow Up Recommendations Home health PT;Supervision/Assistance - 24 hour    Equipment Recommendations  Rolling Hiney with 5" wheels    Recommendations for Other Services       Precautions / Restrictions Precautions Precautions: None Restrictions Weight Bearing Restrictions: Yes LLE Weight Bearing: Weight bearing as tolerated      Mobility  Bed Mobility Overal bed mobility: Needs Assistance Bed Mobility: Supine to Sit;Sit to Supine     Supine to sit: Mod assist Sit to supine: Mod assist   General bed mobility comments: uses railing and position of bed to assist  Transfers Overall transfer level: Needs assistance Equipment used: Rolling Sagraves (2 wheeled) Transfers: Sit to/from UGI CorporationStand;Stand Pivot Transfers Sit to Stand: Min assist Stand pivot transfers: Min guard          Ambulation/Gait Ambulation/Gait assistance: Min assist Ambulation Distance (Feet): 15 Feet Assistive device: Rolling Cassarino (2 wheeled) Gait Pattern/deviations: Step-through pattern;Decreased stride length;Decreased dorsiflexion - left;Decreased weight shift to left;Antalgic;Wide base of support Gait velocity: reduced Gait velocity interpretation: Below normal speed for age/gender General Gait Details: limited tolerance for distance with pain to wb, nursing had not brought meds when pt was up with therapy  Stairs            Wheelchair Mobility     Modified Rankin (Stroke Patients Only)       Balance Overall balance assessment: Needs assistance Sitting-balance support: Feet supported Sitting balance-Leahy Scale: Fair   Postural control: Posterior lean Standing balance support: Bilateral upper extremity supported Standing balance-Leahy Scale: Poor Standing balance comment: unsteady mainly to change directions and back up                             Pertinent Vitals/Pain Pain Assessment: 0-10 Pain Score: 8  Pain Location: L hip Pain Intervention(s): Patient requesting pain meds-RN notified;Limited activity within patient's tolerance;Monitored during session    Home Living Family/patient expects to be discharged to:: Private residence Living Arrangements: Spouse/significant other;Other relatives Available Help at Discharge: Family Type of Home: House Home Access: Stairs to enter Entrance Stairs-Rails: Right Entrance Stairs-Number of Steps: 3 Home Layout: One level Home Equipment: Tub bench;Bedside commode Additional Comments: lives with grandmother and brother.  Per chart, grandmother has dementia    Prior Function Level of Independence: Independent with assistive device(s)               Hand Dominance   Dominant Hand: Right    Extremity/Trunk Assessment   Upper Extremity Assessment: Overall WFL for tasks assessed           Lower Extremity Assessment: LLE deficits/detail   LLE Deficits / Details: New anterior approach L hip with slow process to move but generally ROM limited to abduct and extend  Cervical / Trunk Assessment: Normal  Communication   Communication: No difficulties  Cognition Arousal/Alertness: Awake/alert Behavior During Therapy: WFL for tasks assessed/performed Overall Cognitive Status:  Within Functional Limits for tasks assessed                      General Comments General comments (skin integrity, edema, etc.): Has limited endurance but also waiting for  meds and did agree to therapy.  Declined to be OOB due to waiting meds    Exercises Total Joint Exercises Ankle Circles/Pumps: AROM;Both Quad Sets: AROM;Both Gluteal Sets: AROM;Both Heel Slides: AROM;Left Hip ABduction/ADduction: AROM;Both      Assessment/Plan    PT Assessment Patient needs continued PT services  PT Diagnosis Difficulty walking   PT Problem List Decreased strength;Decreased range of motion;Decreased activity tolerance;Decreased balance;Decreased mobility;Decreased coordination;Decreased knowledge of use of DME;Obesity;Decreased skin integrity;Pain  PT Treatment Interventions DME instruction;Gait training;Stair training;Functional mobility training;Therapeutic activities;Therapeutic exercise;Balance training;Neuromuscular re-education;Patient/family education   PT Goals (Current goals can be found in the Care Plan section) Acute Rehab PT Goals Patient Stated Goal: to go home tomorrow PT Goal Formulation: With patient Time For Goal Achievement: 10/19/14 Potential to Achieve Goals: Good    Frequency 7X/week   Barriers to discharge Other (comment);Inaccessible home environment (Limited mobility and discomfort) needs to try stairs to go home    Co-evaluation               End of Session Equipment Utilized During Treatment: Oxygen Activity Tolerance: Patient limited by pain;Patient limited by fatigue Patient left: in bed;with call bell/phone within reach;with family/visitor present Nurse Communication: Mobility status;Patient requests pain meds         Time: 1435-1501 PT Time Calculation (min): 26 min   Charges:   PT Evaluation $Initial PT Evaluation Tier I: 1 Procedure PT Treatments $Gait Training: 8-22 mins   PT G Codes:          Ivar DrapeStout, Cele Mote E 10/12/2014, 3:25 PM  Samul Dadauth Eddrick Dilone, PT MS Acute Rehab Dept. Number: [phone]

## 2014-10-12 NOTE — Progress Notes (Signed)
OT Cancellation Note  Patient Details Name: Ruben Sutton MRN: [id number] DOB: 07/27/1976   Cancelled Treatment:    Reason Eval/Treat Not Completed: OT screened, no needs identified, will sign off. Pt verbalized he has no OT needs and had previous hip surgery in May. Will sign off.  Earlie RavelingStraub, Spenser Cong L OTR/L [phone] 10/12/2014, 3:57 PM

## 2014-10-12 NOTE — Op Note (Signed)
NAMSkeet Latch:  Sutton, Ruben                ACCOUNT NO.:  [id number]  MEDICAL RECORD NO.:  [id number]  LOCATION:  [id number]                        FACILITY:  [facility]  PHYSICIAN:  Harvie JuniorJohn L. Yoshino Broccoli, M.D.   DATE OF BIRTH:  Sep 03, 1976  DATE OF PROCEDURE:  10/11/2014 DATE OF DISCHARGE:                              OPERATIVE REPORT   PREOPERATIVE DIAGNOSIS:  Severe avascular necrosis, left hip.  POSTOPERATIVE DIAGNOSIS:  Severe avascular necrosis, left hip.  PROCEDURE:  Left total hip replacement with a Corail stem size 10 with a 54 mm pinnacle cup porous coated, a +4 neutral liner and a ceramic +0 hip ball 36 mm.  SURGEON:  Harvie JuniorJohn L. Aibhlinn Kalmar, M.D.  ASSISTANT:  Marshia LyJames Bethune, PA.  ANESTHESIA:  General.  BRIEF HISTORY:  [name] is a 38 year old male with a long history of having had severe bilateral AVN.  We have his right hip several months ago.  He did great with that.  He was having severe unrelenting pain in the left hip after failure of conservative care and MRI showing AVN.  He was taken to the operating room for left total hip replacement.  Because of his young age and active lifestyle, we felt an anterior approach was appropriate.  He was taken to the operating room for this procedure.  DESCRIPTION OF PROCEDURE:  The patient was taken to the operating room. After adequate anesthesia was achieved with general anesthetic, the patient was placed supine on the operating table.  The patient was then placed onto the fracture table on to the HANA bed and preoperative images were taken.  Following this, the patient was prepped and draped in usual sterile fashion.  Following this, an incision was made over the anterior aspect of the femur, subcutaneous tissue down to the level of the tensor fascia.  Tensor fascia was divided in line with its fibers and the fascia was retracted.  The tensor was then retracted laterally. Retractors were put in place and the bleeding was controlled and the blood  supply to the head, and an anterior retractor was put in place. The capsule was opened and tagged, and then following this, a provisional neck cut was made, head was removed, acetabulum was exposed, and sequentially reamed to a level of 53 mm and a 54 pinnacle porous- coated cup was put into place 45 degrees of lateral opening and 20 degrees of anteversion.  Once this was done, attention was turned to the stem side where after exposure, the hip was sequentially rasped up to a level of size 10 Corail stem __________and then a trial reduction was undertaken.  It looked like we had leg length somewhat symmetric, but we took the __________ the leg and then did a trial reduction.  Again, this looked like perfect leg lengths, so at this time, a size 10 Corail stem was used and the Corail was hammered into place.  +0 hip ball was used, excellent fit and fill of the stem and leg lengths were perfectly symmetric at this point.  The leg was extended 80 degrees and externally rotated to 100.  No tendency towards dislocation or subluxation.  At this point, the hip was irrigated, suctioned dry.  The capsule was closed.  The tensor fascia was then closed with 1-Vicryl running.  40 mL of Exparel with Marcaine were instilled all around the hip for postoperative anesthesia.  At this point, the skin was closed with 0 and 2-0 Vicryl and 3-0 Monocryl subcuticular.  Benzoin and Steri-Strips were applied.  Sterile compressive dressing was applied.  The patient was taken to the recovery room and was noted to be in a satisfactory condition.  Estimated blood loss for the procedure was 700 mL.     Harvie JuniorJohn L. Natesha Hassey, M.D.     Ranae PlumberJLG/MEDQ  D:  10/11/2014  T:  10/12/2014  Job:  [id number]

## 2014-10-12 NOTE — Plan of Care (Signed)
Problem: Acute Rehab PT Goals(only PT should resolve) Goal: Pt Will Transfer Bed To Chair/Chair To Bed With RW     

## 2014-10-12 NOTE — Anesthesia Postprocedure Evaluation (Signed)
Anesthesia Post Note  Patient: Ruben BaileyRoger R Hawes  Procedure(s) Performed: Procedure(s) (LRB): TOTAL HIP ARTHROPLASTY ANTERIOR APPROACH (Left)  Anesthesia type: general  Patient location: PACU  Post pain: Pain level controlled  Post assessment: Patient's Cardiovascular Status Stable  Last Vitals:  Filed Vitals:   10/12/14 0556  BP: 170/83  Pulse: 89  Temp: 36.6 C  Resp:     Post vital signs: Reviewed and stable  Level of consciousness: sedated  Complications: No apparent anesthesia complications

## 2014-10-13 LAB — CBC
HCT: 38.7 % — ABNORMAL LOW (ref 39.0–52.0)
Hemoglobin: 12 g/dL — ABNORMAL LOW (ref 13.0–17.0)
MCH: 31.7 pg (ref 26.0–34.0)
MCHC: 31 g/dL (ref 30.0–36.0)
MCV: 102.1 fL — ABNORMAL HIGH (ref 78.0–100.0)
PLATELETS: 87 10*3/uL — AB (ref 150–400)
RBC: 3.79 MIL/uL — AB (ref 4.22–5.81)
RDW: 16 % — AB (ref 11.5–15.5)
WBC: 6.5 10*3/uL (ref 4.0–10.5)

## 2014-10-13 MED ORDER — ASPIRIN EC 325 MG PO TBEC: 325.0000 mg | DELAYED_RELEASE_TABLET | Freq: Two times a day (BID) | ORAL | Status: DC

## 2014-10-13 MED ORDER — OXYCODONE HCL 5 MG PO TABS: 5.0000 mg | ORAL_TABLET | Freq: Four times a day (QID) | ORAL | Status: AC | PRN

## 2014-10-13 MED ORDER — METHOCARBAMOL 750 MG PO TABS: 750.0000 mg | ORAL_TABLET | Freq: Three times a day (TID) | ORAL | Status: AC | PRN
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
## 2014-10-13 NOTE — Progress Notes (Signed)
Subjective: 2 Days Post-Op Procedure(s) (LRB): TOTAL HIP ARTHROPLASTY ANTERIOR APPROACH (Left) Patient reports pain as moderate.  Taking oral pain medication. Progressing with physical therapy. Taking by mouth and voiding okay. Is ready to go home.  Objective: Vital signs in last 24 hours: Temp:  [97.9 F (36.6 C)-98.4 F (36.9 C)] 97.9 F (36.6 C) (10/21 0445) Pulse Rate:  [91] 91 (10/21 0445) Resp:  [16-17] 16 (10/21 0445) BP: (150-165)/(91-99) 150/99 mmHg (10/21 0445) SpO2:  [95 %-97 %] 97 % (10/21 0445)  Intake/Output from previous day: 10/20 0701 - 10/21 0700 In: 1200 [P.O.:960; I.V.:240] Out: 1250 [Urine:1250] Intake/Output this shift: Total I/O In: 240 [P.O.:240] Out: 500 [Urine:500]   Recent Labs  10/12/14 0547 10/13/14 0421  HGB 12.7* 12.0*    Recent Labs  10/12/14 0547 10/13/14 0421  WBC 7.4 6.5  RBC 3.95* 3.79*  HCT 39.6 38.7*  PLT 98* 87*    Recent Labs  10/12/14 0547  NA 134*  K 4.5  CL 92*  CO2 29  BUN 6  CREATININE 0.65  GLUCOSE 175*  CALCIUM 8.3*   Left hip exam: Mild left thigh swelling. No sign of compartment syndrome. Left calf is soft and nontender. Moves toes/foot actively. Dressing is clean and dry. Some bruising on the anterior thigh.   Assessment/Plan: 2 Days Post-Op Procedure(s) (LRB): TOTAL HIP ARTHROPLASTY ANTERIOR APPROACH (Left) Plan: Discharge home today. Will need home health physical therapy. Will take aspirin 325 mg enteric-coated twice daily x1 month postop for DVT prophylaxis. Prescriptions written. WBAT on left. Followup with Dr. Luiz BlareGraves in 2 weeks.   Sashay Felling G 10/13/2014, 11:57 AM

## 2014-10-13 NOTE — Progress Notes (Signed)
Physical Therapy Treatment Patient Details Name: Ruben BaileyRoger R Wagoner MRN: [id number] DOB: 07/11/1976 Today's Date: 10/13/2014    History of Present Illness 38 yo male with new L THA with no precautions, has history of avascular necrosis and COPD, chest pain and EtOH abuse.    PT Comments    Pt was seen for follow up visit with pt declining to do stair climbing due to anxiety and pain in spite of meds having been given 45 minutes prior to PT arrival.  Very impulsive today and had to caution him several times not to stand or move until PT finished getting him set up.  Finally he agreed to second visit today, as he really needs to move more to handle going home.  Follow Up Recommendations  Home health PT;Supervision/Assistance - 24 hour     Equipment Recommendations  Rolling Goodin with 5" wheels    Recommendations for Other Services       Precautions / Restrictions Precautions Precautions: None Restrictions Weight Bearing Restrictions: Yes LLE Weight Bearing: Weight bearing as tolerated    Mobility  Bed Mobility Overal bed mobility: Modified Independent (cautioned to be careful) Bed Mobility: Supine to Sit     Supine to sit: Supervision     General bed mobility comments: uses railing and position of bed to assist  Transfers Overall transfer level: Needs assistance Equipment used: Rolling Navarro (2 wheeled) Transfers: Sit to/from UGI CorporationStand;Stand Pivot Transfers Sit to Stand: Min assist Stand pivot transfers: Min guard       General transfer comment: cautioned to slow down as he tends to stand impulsively before PT has moved lines  Ambulation/Gait Ambulation/Gait assistance: Min assist Ambulation Distance (Feet): 15 Feet Assistive device: Rolling Lesueur (2 wheeled) Gait Pattern/deviations: Step-to pattern;Decreased stride length;Decreased weight shift to left;Shuffle;Wide base of support Gait velocity: reduced Gait velocity interpretation: Below normal speed for  age/gender General Gait Details: Limited himself reporting that he still hurt even after premedication   Stairs Stairs:  (declined)          Wheelchair Mobility    Modified Rankin (Stroke Patients Only)       Balance Overall balance assessment: Needs assistance Sitting-balance support: Feet supported Sitting balance-Leahy Scale: Good   Postural control: Posterior lean Standing balance support: Bilateral upper extremity supported Standing balance-Leahy Scale: Poor Standing balance comment: standing on RLE more than LLE and needs to use standing support of wall bar or Szymanowski                    Cognition Arousal/Alertness: Awake/alert Behavior During Therapy: WFL for tasks assessed/performed Overall Cognitive Status: Within Functional Limits for tasks assessed                      Exercises Total Joint Exercises Gluteal Sets: AROM;Both;10 reps Towel Squeeze: AROM;Both;10 reps Long Arc Quad: Strengthening;Left;10 reps Knee Flexion: Strengthening;Left;10 reps    General Comments General comments (skin integrity, edema, etc.): Limited therapy due to his complaints of pain and need for anxiety meds, which nursing waiting to deliver on schedule      Pertinent Vitals/Pain Pain Assessment: 0-10 Pain Score: 10-Worst pain ever Pain Intervention(s): Limited activity within patient's tolerance;Monitored during session;Premedicated before session;Other (comment);Repositioned (requesting meds for anxiety)    Home Living                      Prior Function            PT Goals (current  goals can now be found in the care plan section) Acute Rehab PT Goals Patient Stated Goal: To get home Progress towards PT goals: PT to reassess next treatment    Frequency  7X/week    PT Plan Current plan remains appropriate    Co-evaluation             End of Session Equipment Utilized During Treatment: Oxygen Activity Tolerance: Patient limited by  pain;Patient limited by fatigue Patient left: in chair;with call bell/phone within reach     Time: 1047-1105 PT Time Calculation (min): 18 min  Charges:  $Gait Training: 8-22 mins                    G Codes:      Ivar DrapeStout, Santi Troung E 10/13/2014, 11:17 AM  Samul Dadauth Eaden Hettinger, PT MS Acute Rehab Dept. Number: [phone]

## 2014-10-13 NOTE — Care Management Note (Signed)
CARE MANAGEMENT NOTE 10/13/2014  Patient:  Ruben Sutton,Ruben Sutton   Account Number:  [id number]  Date Initiated:  10/12/2014  Documentation initiated by:  Vance PeperBRADY,Chikita Dogan  Subjective/Objective Assessment:   38 yr old male admitted with DJD of left hip. Patient had a left total hip arthroplasty.     Action/Plan:   [facility], CM faxed orders to [phone].   Will need DME. Case manager will order rolling Giambalvo.   Anticipated DC Date:  10/13/2014   Anticipated DC Plan:  HOME W HOME HEALTH SERVICES      DC Planning Services  CM consult      PAC Choice  DURABLE MEDICAL EQUIPMENT  HOME HEALTH   Choice offered to / List presented to:     DME arranged  [facility]      DME agency  [facility]     HH arranged  HH[facility]2 PT      [facility]   Status of service:  Completed, signed off Medicare Important Message given?   (If response is "NO", the following Medicare IM given date fields will be blank) Date Medicare IM given:   Medicare IM given by:   Date Additional Medicare IM given:   Additional Medicare IM given by:    Discharge Disposition:  HOME W HOME HEALTH SERVICES  Per UR Regulation:  Reviewed for med. necessity/level of care/duration of stay

## 2014-10-13 NOTE — Care Management Note (Deleted)
CARE MANAGEMENT NOTE 10/13/2014  Patient:  Addison BaileyWALKER,Egidio R   Account Number:  [id number]  Date Initiated:  10/12/2014  Documentation initiated by:  Vance PeperBRADY,Josha Weekley  Subjective/Objective Assessment:   38 yr old male admitted with DJD of left hip. Patient had a left total hip arthroplasty.     Action/Plan:   Patient refused therapy. Will need DME. Case manager will order rolling Mellott.   Anticipated DC Date:  10/13/2014   Anticipated DC Plan:  HOME/SELF CARE      DC Planning Services  CM consult      PAC Choice  DURABLE MEDICAL EQUIPMENT   Choice offered to / List presented to:     DME arranged  Levan HurstWALKER - [facility]      DME agency  [facility]     [facility]  NA      Status of service:  Completed, signed off Medicare Important Message given?   (If response is "NO", the following Medicare IM given date fields will be blank) Date Medicare IM given:   Medicare IM given by:   Date Additional Medicare IM given:   Additional Medicare IM given by:    Discharge Disposition:  HOME/SELF CARE  Per UR Regulation:  Reviewed for med. necessity/level of care/duration of stay  If discussed at Long Length of Stay Meetings, dates discussed:    Comments:

## 2014-10-13 NOTE — Progress Notes (Signed)
Discharge instructions and prescriptions reviewed with patient. Patient denies questions or concerns. IV removed with no complications. VS stable. Patient encouraged to quit smoking to aid in healing process. Wheelchair for home use delivered to patients room. Patient discharged via wheelchair with personal belongings, prescriptions and discharge packet.

## 2014-12-18 ENCOUNTER — Encounter (HOSPITAL_COMMUNITY): Payer: Self-pay

## 2014-12-18 ENCOUNTER — Inpatient Hospital Stay (HOSPITAL_COMMUNITY)
Admission: EM | Admit: 2014-12-18 | Discharge: 2014-12-20 | DRG: 190 | Disposition: A | Discharge status: Home Health | Payer: Medicaid Other | Attending: Internal Medicine | Admitting: Internal Medicine

## 2014-12-18 ENCOUNTER — Emergency Department (HOSPITAL_COMMUNITY): Payer: Medicaid Other

## 2014-12-18 DIAGNOSIS — F17210 Nicotine dependence, cigarettes, uncomplicated: Secondary | ICD-10-CM | POA: Diagnosis present

## 2014-12-18 DIAGNOSIS — D696 Thrombocytopenia, unspecified: Secondary | ICD-10-CM | POA: Diagnosis present

## 2014-12-18 DIAGNOSIS — Z8249 Family history of ischemic heart disease and other diseases of the circulatory system: Secondary | ICD-10-CM

## 2014-12-18 DIAGNOSIS — F10930 Alcohol use, unspecified with withdrawal, uncomplicated: Secondary | ICD-10-CM

## 2014-12-18 DIAGNOSIS — K760 Fatty (change of) liver, not elsewhere classified: Secondary | ICD-10-CM | POA: Diagnosis present

## 2014-12-18 DIAGNOSIS — F10230 Alcohol dependence with withdrawal, uncomplicated: Secondary | ICD-10-CM | POA: Diagnosis present

## 2014-12-18 DIAGNOSIS — Z951 Presence of aortocoronary bypass graft: Secondary | ICD-10-CM

## 2014-12-18 DIAGNOSIS — G43909 Migraine, unspecified, not intractable, without status migrainosus: Secondary | ICD-10-CM | POA: Diagnosis present

## 2014-12-18 DIAGNOSIS — F1010 Alcohol abuse, uncomplicated: Secondary | ICD-10-CM | POA: Diagnosis present

## 2014-12-18 DIAGNOSIS — Z96641 Presence of right artificial hip joint: Secondary | ICD-10-CM | POA: Diagnosis present

## 2014-12-18 DIAGNOSIS — J45901 Unspecified asthma with (acute) exacerbation: Secondary | ICD-10-CM | POA: Diagnosis present

## 2014-12-18 DIAGNOSIS — R0789 Other chest pain: Secondary | ICD-10-CM

## 2014-12-18 DIAGNOSIS — J209 Acute bronchitis, unspecified: Secondary | ICD-10-CM | POA: Diagnosis present

## 2014-12-18 DIAGNOSIS — F419 Anxiety disorder, unspecified: Secondary | ICD-10-CM | POA: Diagnosis present

## 2014-12-18 DIAGNOSIS — F10239 Alcohol dependence with withdrawal, unspecified: Secondary | ICD-10-CM | POA: Diagnosis present

## 2014-12-18 DIAGNOSIS — I2510 Atherosclerotic heart disease of native coronary artery without angina pectoris: Secondary | ICD-10-CM | POA: Diagnosis present

## 2014-12-18 DIAGNOSIS — E876 Hypokalemia: Secondary | ICD-10-CM | POA: Diagnosis present

## 2014-12-18 DIAGNOSIS — I10 Essential (primary) hypertension: Secondary | ICD-10-CM | POA: Diagnosis present

## 2014-12-18 DIAGNOSIS — Z720 Tobacco use: Secondary | ICD-10-CM | POA: Diagnosis present

## 2014-12-18 DIAGNOSIS — M1990 Unspecified osteoarthritis, unspecified site: Secondary | ICD-10-CM | POA: Diagnosis present

## 2014-12-18 DIAGNOSIS — F10939 Alcohol use, unspecified with withdrawal, unspecified: Secondary | ICD-10-CM | POA: Diagnosis present

## 2014-12-18 DIAGNOSIS — R0902 Hypoxemia: Secondary | ICD-10-CM | POA: Diagnosis present

## 2014-12-18 DIAGNOSIS — J441 Chronic obstructive pulmonary disease with (acute) exacerbation: Principal | ICD-10-CM | POA: Diagnosis present

## 2014-12-18 DIAGNOSIS — E871 Hypo-osmolality and hyponatremia: Secondary | ICD-10-CM | POA: Diagnosis present

## 2014-12-18 DIAGNOSIS — J189 Pneumonia, unspecified organism: Secondary | ICD-10-CM | POA: Diagnosis present

## 2014-12-18 DIAGNOSIS — K219 Gastro-esophageal reflux disease without esophagitis: Secondary | ICD-10-CM | POA: Diagnosis present

## 2014-12-18 DIAGNOSIS — F329 Major depressive disorder, single episode, unspecified: Secondary | ICD-10-CM | POA: Diagnosis present

## 2014-12-18 DIAGNOSIS — R079 Chest pain, unspecified: Secondary | ICD-10-CM | POA: Diagnosis present

## 2014-12-18 LAB — BASIC METABOLIC PANEL
Anion gap: 15 (ref 5–15)
CALCIUM: 8.6 mg/dL (ref 8.4–10.5)
CHLORIDE: 97 meq/L (ref 96–112)
CO2: 22 mmol/L (ref 19–32)
CREATININE: 0.7 mg/dL (ref 0.50–1.35)
GFR calc Af Amer: >90 mL/min (ref >90)
GFR calc non Af Amer: >90 mL/min (ref >90)
GLUCOSE: 139 mg/dL — AB (ref 70–99)
Potassium: 3.4 mmol/L — ABNORMAL LOW (ref 3.5–5.1)
Sodium: 134 mmol/L — ABNORMAL LOW (ref 135–145)

## 2014-12-18 LAB — I-STAT TROPONIN, ED: Troponin i, poc: 0 ng/mL (ref 0.00–0.08)

## 2014-12-18 LAB — CBC
HEMATOCRIT: 43.4 % (ref 39.0–52.0)
Hemoglobin: 14.2 g/dL (ref 13.0–17.0)
MCH: 30.6 pg (ref 26.0–34.0)
MCHC: 32.7 g/dL (ref 30.0–36.0)
MCV: 93.5 fL (ref 78.0–100.0)
Platelets: 118 10*3/uL — ABNORMAL LOW (ref 150–400)
RBC: 4.64 MIL/uL (ref 4.22–5.81)
RDW: 16.3 % — ABNORMAL HIGH (ref 11.5–15.5)
WBC: 5.2 10*3/uL (ref 4.0–10.5)

## 2014-12-18 LAB — I-STAT ARTERIAL BLOOD GAS, ED
Acid-base deficit: 6 mmol/L — ABNORMAL HIGH (ref 0.0–2.0)
BICARBONATE: 19.7 meq/L — AB (ref 20.0–24.0)
O2 Saturation: 94 %
PCO2 ART: 39.7 mmHg (ref 35.0–45.0)
PH ART: 7.304 — AB (ref 7.350–7.450)
Patient temperature: 98.6
TCO2: 21 mmol/L (ref 0–100)
pO2, Arterial: 78 mmHg — ABNORMAL LOW (ref 80.0–100.0)

## 2014-12-18 LAB — BRAIN NATRIURETIC PEPTIDE: B Natriuretic Peptide: 66.7 pg/mL (ref 0.0–100.0)

## 2014-12-18 MED ORDER — ONDANSETRON HCL 4 MG/2ML IJ SOLN
4.0000 mg | Freq: Once | INTRAMUSCULAR | Status: AC
  Administered 2014-12-18: 4 mg via INTRAVENOUS
  Filled 2014-12-18: qty 2

## 2014-12-18 MED ORDER — ALBUTEROL (5 MG/ML) CONTINUOUS INHALATION SOLN
10.0000 mg/h | INHALATION_SOLUTION | Freq: Once | RESPIRATORY_TRACT | Status: AC
  Administered 2014-12-18: 10 mg/h via RESPIRATORY_TRACT
  Filled 2014-12-18: qty 20

## 2014-12-18 MED ORDER — ASPIRIN 325 MG PO TABS
325.0000 mg | ORAL_TABLET | ORAL | Status: AC
  Administered 2014-12-18: 325 mg via ORAL
  Filled 2014-12-18: qty 1

## 2014-12-18 MED ORDER — LEVOFLOXACIN IN D5W 750 MG/150ML IV SOLN
750.0000 mg | INTRAVENOUS | Status: DC
  Administered 2014-12-19: 750 mg via INTRAVENOUS
  Filled 2014-12-18 (×2): qty 150

## 2014-12-18 MED ORDER — ONDANSETRON HCL 4 MG/2ML IJ SOLN: 4.0000 mg | Freq: Four times a day (QID) | INTRAMUSCULAR | Status: DC | PRN

## 2014-12-18 MED ORDER — ONDANSETRON HCL 4 MG PO TABS: 4.0000 mg | ORAL_TABLET | Freq: Four times a day (QID) | ORAL | Status: DC | PRN

## 2014-12-18 MED ORDER — LORAZEPAM 2 MG/ML IJ SOLN
1.0000 mg | Freq: Once | INTRAMUSCULAR | Status: AC
  Administered 2014-12-18: 1 mg via INTRAVENOUS

## 2014-12-18 MED ORDER — IPRATROPIUM BROMIDE 0.02 % IN SOLN
0.5000 mg | RESPIRATORY_TRACT | Status: DC
  Administered 2014-12-19: 0.5 mg via RESPIRATORY_TRACT
  Filled 2014-12-18: qty 2.5

## 2014-12-18 MED ORDER — ACETAMINOPHEN 325 MG PO TABS
650.0000 mg | ORAL_TABLET | Freq: Four times a day (QID) | ORAL | Status: DC | PRN
  Administered 2014-12-19 – 2014-12-20 (×2): 650 mg via ORAL
  Filled 2014-12-18 (×2): qty 2

## 2014-12-18 MED ORDER — MORPHINE SULFATE 4 MG/ML IJ SOLN
4.0000 mg | Freq: Once | INTRAMUSCULAR | Status: AC
  Administered 2014-12-18: 4 mg via INTRAVENOUS
  Filled 2014-12-18: qty 1

## 2014-12-18 MED ORDER — VITAMIN B-1 100 MG PO TABS
100.0000 mg | ORAL_TABLET | Freq: Every day | ORAL | Status: DC
  Administered 2014-12-19: 100 mg via ORAL
  Filled 2014-12-18 (×2): qty 1

## 2014-12-18 MED ORDER — ENOXAPARIN SODIUM 40 MG/0.4ML ~~LOC~~ SOLN
40.0000 mg | Freq: Every day | SUBCUTANEOUS | Status: DC
  Administered 2014-12-19: 40 mg via SUBCUTANEOUS
  Filled 2014-12-18: qty 0.4

## 2014-12-18 MED ORDER — SODIUM CHLORIDE 0.9 % IJ SOLN
3.0000 mL | Freq: Two times a day (BID) | INTRAMUSCULAR | Status: DC
  Administered 2014-12-19 (×3): 3 mL via INTRAVENOUS

## 2014-12-18 MED ORDER — NICOTINE 21 MG/24HR TD PT24
21.0000 mg | MEDICATED_PATCH | Freq: Every day | TRANSDERMAL | Status: DC
  Administered 2014-12-19 (×2): 21 mg via TRANSDERMAL
  Filled 2014-12-18 (×3): qty 1

## 2014-12-18 MED ORDER — LORAZEPAM 2 MG/ML IJ SOLN
2.0000 mg | INTRAMUSCULAR | Status: DC | PRN
  Administered 2014-12-18 – 2014-12-20 (×13): 2 mg via INTRAVENOUS
  Filled 2014-12-18 (×13): qty 1

## 2014-12-18 MED ORDER — OSELTAMIVIR PHOSPHATE 75 MG PO CAPS
75.0000 mg | ORAL_CAPSULE | Freq: Two times a day (BID) | ORAL | Status: DC
  Administered 2014-12-19 (×2): 75 mg via ORAL
  Filled 2014-12-18 (×5): qty 1

## 2014-12-18 MED ORDER — LEVALBUTEROL HCL 0.63 MG/3ML IN NEBU
0.6300 mg | INHALATION_SOLUTION | Freq: Four times a day (QID) | RESPIRATORY_TRACT | Status: DC
  Administered 2014-12-19: 0.63 mg via RESPIRATORY_TRACT
  Filled 2014-12-18: qty 3

## 2014-12-18 MED ORDER — ASPIRIN 81 MG PO CHEW: 324.0000 mg | CHEWABLE_TABLET | Freq: Once | ORAL | Status: DC

## 2014-12-18 MED ORDER — GUAIFENESIN ER 600 MG PO TB12
600.0000 mg | ORAL_TABLET | Freq: Two times a day (BID) | ORAL | Status: DC
  Administered 2014-12-19 (×3): 600 mg via ORAL
  Filled 2014-12-18 (×6): qty 1

## 2014-12-18 MED ORDER — ASPIRIN EC 325 MG PO TBEC: 325.0000 mg | DELAYED_RELEASE_TABLET | Freq: Two times a day (BID) | ORAL | Status: DC

## 2014-12-18 MED ORDER — LORAZEPAM 2 MG/ML IJ SOLN
1.0000 mg | Freq: Once | INTRAMUSCULAR | Status: AC
  Administered 2014-12-18: 1 mg via INTRAVENOUS
  Filled 2014-12-18: qty 1

## 2014-12-18 MED ORDER — MAGNESIUM SULFATE 2 GM/50ML IV SOLN
2.0000 g | Freq: Once | INTRAVENOUS | Status: AC
  Administered 2014-12-18: 2 g via INTRAVENOUS
  Filled 2014-12-18: qty 50

## 2014-12-18 MED ORDER — IOHEXOL 350 MG/ML SOLN
100.0000 mL | Freq: Once | INTRAVENOUS | Status: AC | PRN
  Administered 2014-12-18: 100 mL via INTRAVENOUS

## 2014-12-18 MED ORDER — LEVOFLOXACIN IN D5W 500 MG/100ML IV SOLN
500.0000 mg | Freq: Once | INTRAVENOUS | Status: AC
  Administered 2014-12-18: 500 mg via INTRAVENOUS
  Filled 2014-12-18: qty 100

## 2014-12-18 MED ORDER — ASPIRIN EC 325 MG PO TBEC
325.0000 mg | DELAYED_RELEASE_TABLET | Freq: Every day | ORAL | Status: DC
  Administered 2014-12-19: 325 mg via ORAL
  Filled 2014-12-18 (×2): qty 1

## 2014-12-18 MED ORDER — METHYLPREDNISOLONE SODIUM SUCC 125 MG IJ SOLR
60.0000 mg | Freq: Four times a day (QID) | INTRAMUSCULAR | Status: DC
  Administered 2014-12-19 (×2): 60 mg via INTRAVENOUS
  Filled 2014-12-18: qty 2
  Filled 2014-12-18 (×4): qty 0.96
  Filled 2014-12-18: qty 2

## 2014-12-18 MED ORDER — THIAMINE HCL 100 MG/ML IJ SOLN
Freq: Once | INTRAVENOUS | Status: AC
  Administered 2014-12-19: 01:00:00 via INTRAVENOUS
  Filled 2014-12-18: qty 1000

## 2014-12-18 MED ORDER — ACETAMINOPHEN 650 MG RE SUPP: 650.0000 mg | Freq: Four times a day (QID) | RECTAL | Status: DC | PRN

## 2014-12-18 MED ORDER — FOLIC ACID 1 MG PO TABS
1.0000 mg | ORAL_TABLET | Freq: Every day | ORAL | Status: DC
  Administered 2014-12-19: 1 mg via ORAL
  Filled 2014-12-18 (×2): qty 1

## 2014-12-18 NOTE — ED Notes (Signed)
To room via EMS.  Onset 5am pt woke up with left sided chest pain, radiating to left arm, left jaw and left back.  No radiation at this time.  Chest pain 10/10 pain scale.   Chest pain constant worse with movement and cough.  Pt also with shortness of breath.  Pt out of all meds at home.  Pt drinks fifth of liquor qd, only had 1 shot this morning- out of alcohol.  Pt is jittery and trembling.  EMS gave Solumedrol 125mtg, Zofran 4mg , Albuterol 15 mg, Atrovent 1mg .
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
## 2014-12-19 DIAGNOSIS — R7989 Other specified abnormal findings of blood chemistry: Secondary | ICD-10-CM

## 2014-12-19 DIAGNOSIS — J45901 Unspecified asthma with (acute) exacerbation: Secondary | ICD-10-CM

## 2014-12-19 DIAGNOSIS — R071 Chest pain on breathing: Secondary | ICD-10-CM

## 2014-12-19 LAB — RAPID URINE DRUG SCREEN, HOSP PERFORMED
AMPHETAMINES: NOT DETECTED
Barbiturates: NOT DETECTED
Benzodiazepines: POSITIVE — AB
Cocaine: NOT DETECTED
Opiates: POSITIVE — AB
Tetrahydrocannabinol: NOT DETECTED

## 2014-12-19 LAB — COMPREHENSIVE METABOLIC PANEL
ALBUMIN: 2.6 g/dL — AB (ref 3.5–5.2)
ALT: 29 U/L (ref 0–53)
ANION GAP: 11 (ref 5–15)
AST: 61 U/L — ABNORMAL HIGH (ref 0–37)
Alkaline Phosphatase: 86 U/L (ref 39–117)
BUN: 5 mg/dL — AB (ref 6–23)
CO2: 23 mmol/L (ref 19–32)
CREATININE: 0.88 mg/dL (ref 0.50–1.35)
Calcium: 8.5 mg/dL (ref 8.4–10.5)
Chloride: 94 mEq/L — ABNORMAL LOW (ref 96–112)
GFR calc non Af Amer: >90 mL/min (ref >90)
Glucose, Bld: 313 mg/dL — ABNORMAL HIGH (ref 70–99)
Potassium: 3.4 mmol/L — ABNORMAL LOW (ref 3.5–5.1)
Sodium: 128 mmol/L — ABNORMAL LOW (ref 135–145)
TOTAL PROTEIN: 7.3 g/dL (ref 6.0–8.3)
Total Bilirubin: 0.5 mg/dL (ref 0.3–1.2)

## 2014-12-19 LAB — CBC WITH DIFFERENTIAL/PLATELET
Basophils Absolute: 0 10*3/uL (ref 0.0–0.1)
Basophils Relative: 0 % (ref 0–1)
EOS ABS: 0 10*3/uL (ref 0.0–0.7)
Eosinophils Relative: 0 % (ref 0–5)
HEMATOCRIT: 40.3 % (ref 39.0–52.0)
Hemoglobin: 13 g/dL (ref 13.0–17.0)
LYMPHS ABS: 0.3 10*3/uL — AB (ref 0.7–4.0)
Lymphocytes Relative: 7 % — ABNORMAL LOW (ref 12–46)
MCH: 30.7 pg (ref 26.0–34.0)
MCHC: 32.3 g/dL (ref 30.0–36.0)
MCV: 95.3 fL (ref 78.0–100.0)
Monocytes Absolute: 0.2 10*3/uL (ref 0.1–1.0)
Monocytes Relative: 6 % (ref 3–12)
NEUTROS ABS: 3.3 10*3/uL (ref 1.7–7.7)
Neutrophils Relative %: 87 % — ABNORMAL HIGH (ref 43–77)
Platelets: ADEQUATE 10*3/uL (ref 150–400)
RBC: 4.23 MIL/uL (ref 4.22–5.81)
RDW: 16.4 % — ABNORMAL HIGH (ref 11.5–15.5)
WBC: 3.8 10*3/uL — ABNORMAL LOW (ref 4.0–10.5)

## 2014-12-19 LAB — TROPONIN I
TROPONIN I: 0.08 ng/mL — AB (ref <0.031)
Troponin I: 0.13 ng/mL — ABNORMAL HIGH (ref <0.031)
Troponin I: 0.16 ng/mL — ABNORMAL HIGH (ref <0.031)

## 2014-12-19 LAB — INFLUENZA PANEL BY PCR (TYPE A & B)
H1N1 flu by pcr: NOT DETECTED
INFLBPCR: NEGATIVE
Influenza A By PCR: NEGATIVE

## 2014-12-19 LAB — MRSA PCR SCREENING: MRSA BY PCR: NEGATIVE

## 2014-12-19 LAB — PROTIME-INR
INR: 1.33 (ref 0.00–1.49)
Prothrombin Time: 16.7 seconds — ABNORMAL HIGH (ref 11.6–15.2)

## 2014-12-19 LAB — STREP PNEUMONIAE URINARY ANTIGEN: Strep Pneumo Urinary Antigen: NEGATIVE

## 2014-12-19 MED ORDER — METOPROLOL TARTRATE 50 MG PO TABS
50.0000 mg | ORAL_TABLET | Freq: Two times a day (BID) | ORAL | Status: DC
  Filled 2014-12-19 (×2): qty 1

## 2014-12-19 MED ORDER — LISINOPRIL 10 MG PO TABS
10.0000 mg | ORAL_TABLET | Freq: Every day | ORAL | Status: DC
  Administered 2014-12-19: 10 mg via ORAL
  Filled 2014-12-19 (×2): qty 1

## 2014-12-19 MED ORDER — METOPROLOL TARTRATE 50 MG PO TABS
50.0000 mg | ORAL_TABLET | Freq: Two times a day (BID) | ORAL | Status: DC
  Administered 2014-12-19 (×2): 50 mg via ORAL
  Filled 2014-12-19 (×5): qty 1

## 2014-12-19 MED ORDER — METHYLPREDNISOLONE SODIUM SUCC 125 MG IJ SOLR
60.0000 mg | Freq: Two times a day (BID) | INTRAMUSCULAR | Status: DC
  Administered 2014-12-19 – 2014-12-20 (×2): 60 mg via INTRAVENOUS
  Filled 2014-12-19 (×2): qty 0.96

## 2014-12-19 MED ORDER — IPRATROPIUM-ALBUTEROL 0.5-2.5 (3) MG/3ML IN SOLN
3.0000 mL | Freq: Four times a day (QID) | RESPIRATORY_TRACT | Status: DC
  Administered 2014-12-19 (×3): 3 mL via RESPIRATORY_TRACT
  Filled 2014-12-19 (×4): qty 3

## 2014-12-19 MED ORDER — ALBUTEROL SULFATE (2.5 MG/3ML) 0.083% IN NEBU: 2.5000 mg | INHALATION_SOLUTION | RESPIRATORY_TRACT | Status: DC | PRN

## 2014-12-19 MED ORDER — METOPROLOL TARTRATE 1 MG/ML IV SOLN
INTRAVENOUS | Status: AC
  Filled 2014-12-19: qty 5

## 2014-12-19 MED ORDER — POTASSIUM CHLORIDE IN NACL 40-0.9 MEQ/L-% IV SOLN
INTRAVENOUS | Status: DC
  Administered 2014-12-19 – 2014-12-20 (×2): 100 mL/h via INTRAVENOUS
  Filled 2014-12-19 (×4): qty 1000

## 2014-12-19 MED ORDER — METOPROLOL TARTRATE 1 MG/ML IV SOLN
5.0000 mg | Freq: Once | INTRAVENOUS | Status: AC
  Administered 2014-12-19: 5 mg via INTRAVENOUS

## 2014-12-19 MED ORDER — METOPROLOL TARTRATE 25 MG PO TABS
25.0000 mg | ORAL_TABLET | Freq: Two times a day (BID) | ORAL | Status: DC
  Administered 2014-12-19: 25 mg via ORAL
  Filled 2014-12-19 (×3): qty 1

## 2014-12-19 MED ORDER — ENOXAPARIN SODIUM 60 MG/0.6ML ~~LOC~~ SOLN
55.0000 mg | Freq: Every day | SUBCUTANEOUS | Status: DC
  Filled 2014-12-19: qty 0.6
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
## 2014-12-20 LAB — CBC
HEMATOCRIT: 43 % (ref 39.0–52.0)
Hemoglobin: 13.4 g/dL (ref 13.0–17.0)
MCH: 30.2 pg (ref 26.0–34.0)
MCHC: 31.2 g/dL (ref 30.0–36.0)
MCV: 96.8 fL (ref 78.0–100.0)
Platelets: 123 10*3/uL — ABNORMAL LOW (ref 150–400)
RBC: 4.44 MIL/uL (ref 4.22–5.81)
RDW: 16.4 % — AB (ref 11.5–15.5)
WBC: 11.4 10*3/uL — ABNORMAL HIGH (ref 4.0–10.5)

## 2014-12-20 LAB — BASIC METABOLIC PANEL
ANION GAP: 9 (ref 5–15)
BUN: 12 mg/dL (ref 6–23)
CALCIUM: 8.9 mg/dL (ref 8.4–10.5)
CO2: 26 mmol/L (ref 19–32)
CREATININE: 0.62 mg/dL (ref 0.50–1.35)
Chloride: 99 mEq/L (ref 96–112)
GFR calc non Af Amer: >90 mL/min (ref >90)
Glucose, Bld: 140 mg/dL — ABNORMAL HIGH (ref 70–99)
Potassium: 5 mmol/L (ref 3.5–5.1)
Sodium: 134 mmol/L — ABNORMAL LOW (ref 135–145)

## 2014-12-20 NOTE — Progress Notes (Signed)
Retro ur ins review. 
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
## 2014-12-20 NOTE — Progress Notes (Signed)
Pt stated again he wished to leave AMA and his ride had been waiting on him. Explained to pt why it was recommended he stay in the hospital. Pt still wished to leave. Pt signed the Leaving Hospital Against Medical Advice form. IVs were removed and pt walked off the unit.

## 2014-12-21 ENCOUNTER — Emergency Department (HOSPITAL_COMMUNITY): Payer: Medicaid Other

## 2014-12-21 ENCOUNTER — Encounter (HOSPITAL_COMMUNITY): Payer: Self-pay | Admitting: Emergency Medicine

## 2014-12-21 ENCOUNTER — Other Ambulatory Visit: Payer: Self-pay

## 2014-12-21 ENCOUNTER — Inpatient Hospital Stay (HOSPITAL_COMMUNITY)
Admission: EM | Admit: 2014-12-21 | Discharge: 2014-12-23 | DRG: 189 | Discharge status: Against Medical Advice | Payer: Medicaid Other | Attending: Internal Medicine | Admitting: Internal Medicine

## 2014-12-21 DIAGNOSIS — J441 Chronic obstructive pulmonary disease with (acute) exacerbation: Secondary | ICD-10-CM | POA: Diagnosis present

## 2014-12-21 DIAGNOSIS — M1990 Unspecified osteoarthritis, unspecified site: Secondary | ICD-10-CM | POA: Diagnosis present

## 2014-12-21 DIAGNOSIS — F1010 Alcohol abuse, uncomplicated: Secondary | ICD-10-CM | POA: Diagnosis present

## 2014-12-21 DIAGNOSIS — D696 Thrombocytopenia, unspecified: Secondary | ICD-10-CM | POA: Diagnosis present

## 2014-12-21 DIAGNOSIS — R0603 Acute respiratory distress: Secondary | ICD-10-CM | POA: Diagnosis present

## 2014-12-21 DIAGNOSIS — J45901 Unspecified asthma with (acute) exacerbation: Secondary | ICD-10-CM | POA: Diagnosis present

## 2014-12-21 DIAGNOSIS — Z96649 Presence of unspecified artificial hip joint: Secondary | ICD-10-CM | POA: Diagnosis present

## 2014-12-21 DIAGNOSIS — K7010 Alcoholic hepatitis without ascites: Secondary | ICD-10-CM | POA: Diagnosis present

## 2014-12-21 DIAGNOSIS — F10230 Alcohol dependence with withdrawal, uncomplicated: Secondary | ICD-10-CM

## 2014-12-21 DIAGNOSIS — E872 Acidosis: Secondary | ICD-10-CM | POA: Diagnosis present

## 2014-12-21 DIAGNOSIS — F329 Major depressive disorder, single episode, unspecified: Secondary | ICD-10-CM | POA: Diagnosis present

## 2014-12-21 DIAGNOSIS — J9602 Acute respiratory failure with hypercapnia: Principal | ICD-10-CM

## 2014-12-21 DIAGNOSIS — I10 Essential (primary) hypertension: Secondary | ICD-10-CM | POA: Diagnosis present

## 2014-12-21 DIAGNOSIS — F10939 Alcohol use, unspecified with withdrawal, unspecified: Secondary | ICD-10-CM | POA: Diagnosis present

## 2014-12-21 DIAGNOSIS — J189 Pneumonia, unspecified organism: Secondary | ICD-10-CM | POA: Diagnosis present

## 2014-12-21 DIAGNOSIS — G9340 Encephalopathy, unspecified: Secondary | ICD-10-CM | POA: Diagnosis present

## 2014-12-21 DIAGNOSIS — G8929 Other chronic pain: Secondary | ICD-10-CM | POA: Diagnosis present

## 2014-12-21 DIAGNOSIS — Z951 Presence of aortocoronary bypass graft: Secondary | ICD-10-CM | POA: Diagnosis not present

## 2014-12-21 DIAGNOSIS — F419 Anxiety disorder, unspecified: Secondary | ICD-10-CM | POA: Diagnosis present

## 2014-12-21 DIAGNOSIS — F17200 Nicotine dependence, unspecified, uncomplicated: Secondary | ICD-10-CM | POA: Diagnosis present

## 2014-12-21 DIAGNOSIS — Z515 Encounter for palliative care: Secondary | ICD-10-CM

## 2014-12-21 DIAGNOSIS — F17210 Nicotine dependence, cigarettes, uncomplicated: Secondary | ICD-10-CM | POA: Diagnosis present

## 2014-12-21 DIAGNOSIS — R0602 Shortness of breath: Secondary | ICD-10-CM | POA: Diagnosis present

## 2014-12-21 DIAGNOSIS — K219 Gastro-esophageal reflux disease without esophagitis: Secondary | ICD-10-CM | POA: Diagnosis present

## 2014-12-21 DIAGNOSIS — E871 Hypo-osmolality and hyponatremia: Secondary | ICD-10-CM | POA: Diagnosis present

## 2014-12-21 DIAGNOSIS — F10239 Alcohol dependence with withdrawal, unspecified: Secondary | ICD-10-CM | POA: Diagnosis present

## 2014-12-21 DIAGNOSIS — R079 Chest pain, unspecified: Secondary | ICD-10-CM | POA: Diagnosis present

## 2014-12-21 LAB — BLOOD GAS, ARTERIAL
ACID-BASE EXCESS: 0.2 mmol/L (ref 0.0–2.0)
BICARBONATE: 28.6 meq/L — AB (ref 20.0–24.0)
Drawn by: 36989
O2 Saturation: 91.5 %
PCO2 ART: 89.4 mmHg — AB (ref 35.0–45.0)
PH ART: 7.132 — AB (ref 7.350–7.450)
PO2 ART: 87 mmHg (ref 80.0–100.0)
Patient temperature: 98.6
TCO2: 31.4 mmol/L (ref 0–100)

## 2014-12-21 LAB — CBC WITH DIFFERENTIAL/PLATELET
BASOS PCT: 0 % (ref 0–1)
Basophils Absolute: 0 10*3/uL (ref 0.0–0.1)
Eosinophils Absolute: 0 10*3/uL (ref 0.0–0.7)
Eosinophils Relative: 0 % (ref 0–5)
HCT: 44.4 % (ref 39.0–52.0)
Hemoglobin: 14.2 g/dL (ref 13.0–17.0)
LYMPHS ABS: 2.5 10*3/uL (ref 0.7–4.0)
Lymphocytes Relative: 25 % (ref 12–46)
MCH: 30.5 pg (ref 26.0–34.0)
MCHC: 32 g/dL (ref 30.0–36.0)
MCV: 95.3 fL (ref 78.0–100.0)
Monocytes Absolute: 1.3 10*3/uL — ABNORMAL HIGH (ref 0.1–1.0)
Monocytes Relative: 13 % — ABNORMAL HIGH (ref 3–12)
NEUTROS PCT: 62 % (ref 43–77)
Neutro Abs: 6.3 10*3/uL (ref 1.7–7.7)
PLATELETS: 139 10*3/uL — AB (ref 150–400)
RBC: 4.66 MIL/uL (ref 4.22–5.81)
RDW: 16.5 % — ABNORMAL HIGH (ref 11.5–15.5)
WBC: 10.1 10*3/uL (ref 4.0–10.5)

## 2014-12-21 LAB — COMPREHENSIVE METABOLIC PANEL
ALBUMIN: 2.9 g/dL — AB (ref 3.5–5.2)
ALT: 66 U/L — ABNORMAL HIGH (ref 0–53)
AST: 117 U/L — ABNORMAL HIGH (ref 0–37)
Alkaline Phosphatase: 107 U/L (ref 39–117)
Anion gap: 9 (ref 5–15)
BILIRUBIN TOTAL: 0.4 mg/dL (ref 0.3–1.2)
BUN: 10 mg/dL (ref 6–23)
CO2: 27 mmol/L (ref 19–32)
Calcium: 8.4 mg/dL (ref 8.4–10.5)
Chloride: 95 mEq/L — ABNORMAL LOW (ref 96–112)
Creatinine, Ser: 0.92 mg/dL (ref 0.50–1.35)
GFR calc Af Amer: >90 mL/min (ref >90)
GFR calc non Af Amer: >90 mL/min (ref >90)
Glucose, Bld: 153 mg/dL — ABNORMAL HIGH (ref 70–99)
Potassium: 4.8 mmol/L (ref 3.5–5.1)
Sodium: 131 mmol/L — ABNORMAL LOW (ref 135–145)
Total Protein: 7.8 g/dL (ref 6.0–8.3)

## 2014-12-21 LAB — GLUCOSE, CAPILLARY: GLUCOSE-CAPILLARY: 204 mg/dL — AB (ref 70–99)

## 2014-12-21 LAB — I-STAT TROPONIN, ED: Troponin i, poc: 0.01 ng/mL (ref 0.00–0.08)

## 2014-12-21 LAB — BASIC METABOLIC PANEL WITH GFR
Anion gap: 11 (ref 5–15)
BUN: 10 mg/dL (ref 6–23)
CO2: 27 mmol/L (ref 19–32)
Calcium: 8.3 mg/dL — ABNORMAL LOW (ref 8.4–10.5)
Chloride: 93 meq/L — ABNORMAL LOW (ref 96–112)
Creatinine, Ser: 0.9 mg/dL (ref 0.50–1.35)
GFR calc Af Amer: >90 mL/min
GFR calc non Af Amer: >90 mL/min
Glucose, Bld: 92 mg/dL (ref 70–99)
Potassium: 4.1 mmol/L (ref 3.5–5.1)
Sodium: 131 mmol/L — ABNORMAL LOW (ref 135–145)

## 2014-12-21 LAB — PROTIME-INR
INR: 1.14 (ref 0.00–1.49)
Prothrombin Time: 14.8 seconds (ref 11.6–15.2)

## 2014-12-21 LAB — LEGIONELLA ANTIGEN, URINE

## 2014-12-21 LAB — D-DIMER, QUANTITATIVE: D-Dimer, Quant: 1.24 ug/mL-FEU — ABNORMAL HIGH (ref 0.00–0.48)

## 2014-12-21 LAB — PHOSPHORUS: Phosphorus: 7.8 mg/dL — ABNORMAL HIGH (ref 2.3–4.6)

## 2014-12-21 LAB — MAGNESIUM: Magnesium: 2.2 mg/dL (ref 1.5–2.5)

## 2014-12-21 LAB — TROPONIN I: Troponin I: 0.03 ng/mL

## 2014-12-21 MED ORDER — VITAMIN B-1 100 MG PO TABS
100.0000 mg | ORAL_TABLET | Freq: Every day | ORAL | Status: DC
  Administered 2014-12-23: 100 mg via ORAL
  Filled 2014-12-21 (×2): qty 1

## 2014-12-21 MED ORDER — ENOXAPARIN SODIUM 40 MG/0.4ML ~~LOC~~ SOLN
40.0000 mg | SUBCUTANEOUS | Status: DC
  Administered 2014-12-21: 40 mg via SUBCUTANEOUS
  Filled 2014-12-21 (×2): qty 0.4

## 2014-12-21 MED ORDER — IPRATROPIUM-ALBUTEROL 0.5-2.5 (3) MG/3ML IN SOLN
3.0000 mL | Freq: Once | RESPIRATORY_TRACT | Status: AC
  Administered 2014-12-21: 3 mL via RESPIRATORY_TRACT
  Filled 2014-12-21: qty 3

## 2014-12-21 MED ORDER — THIAMINE HCL 100 MG/ML IJ SOLN
100.0000 mg | Freq: Every day | INTRAMUSCULAR | Status: DC
  Administered 2014-12-22 – 2014-12-23 (×2): 100 mg via INTRAVENOUS
  Filled 2014-12-21 (×2): qty 1

## 2014-12-21 MED ORDER — ASPIRIN EC 325 MG PO TBEC
325.0000 mg | DELAYED_RELEASE_TABLET | Freq: Two times a day (BID) | ORAL | Status: DC
  Administered 2014-12-22: 325 mg via ORAL
  Filled 2014-12-21 (×3): qty 1

## 2014-12-21 MED ORDER — LEVALBUTEROL HCL 0.63 MG/3ML IN NEBU: 0.6300 mg | INHALATION_SOLUTION | RESPIRATORY_TRACT | Status: DC | PRN

## 2014-12-21 MED ORDER — LORAZEPAM 2 MG/ML IJ SOLN: 0.0000 mg | Freq: Two times a day (BID) | INTRAMUSCULAR | Status: DC

## 2014-12-21 MED ORDER — METHYLPREDNISOLONE SODIUM SUCC 125 MG IJ SOLR
80.0000 mg | Freq: Once | INTRAMUSCULAR | Status: AC
  Administered 2014-12-21: 80 mg via INTRAVENOUS
  Filled 2014-12-21: qty 2

## 2014-12-21 MED ORDER — OXYCODONE-ACETAMINOPHEN 5-325 MG PO TABS
1.0000 | ORAL_TABLET | ORAL | Status: DC | PRN
Start: 2014-12-21 — End: 2014-12-21

## 2014-12-21 MED ORDER — METHOCARBAMOL 750 MG PO TABS
750.0000 mg | ORAL_TABLET | Freq: Three times a day (TID) | ORAL | Status: DC | PRN
  Filled 2014-12-21: qty 1

## 2014-12-21 MED ORDER — GUAIFENESIN ER 600 MG PO TB12
600.0000 mg | ORAL_TABLET | Freq: Two times a day (BID) | ORAL | Status: DC
  Administered 2014-12-22 – 2014-12-23 (×3): 600 mg via ORAL
  Filled 2014-12-21 (×5): qty 1

## 2014-12-21 MED ORDER — NICOTINE 21 MG/24HR TD PT24
21.0000 mg | MEDICATED_PATCH | Freq: Every day | TRANSDERMAL | Status: DC
  Administered 2014-12-22 – 2014-12-23 (×2): 21 mg via TRANSDERMAL
  Filled 2014-12-21 (×2): qty 1

## 2014-12-21 MED ORDER — IPRATROPIUM BROMIDE 0.02 % IN SOLN
0.5000 mg | Freq: Four times a day (QID) | RESPIRATORY_TRACT | Status: DC
  Administered 2014-12-21: 0.5 mg via RESPIRATORY_TRACT

## 2014-12-21 MED ORDER — LORAZEPAM 1 MG PO TABS: 0.0000 mg | ORAL_TABLET | Freq: Two times a day (BID) | ORAL | Status: DC

## 2014-12-21 MED ORDER — LORAZEPAM 1 MG PO TABS: 0.0000 mg | ORAL_TABLET | Freq: Four times a day (QID) | ORAL | Status: DC

## 2014-12-21 MED ORDER — LEVALBUTEROL HCL 0.63 MG/3ML IN NEBU
0.6300 mg | INHALATION_SOLUTION | Freq: Four times a day (QID) | RESPIRATORY_TRACT | Status: DC
  Administered 2014-12-21 – 2014-12-23 (×6): 0.63 mg via RESPIRATORY_TRACT
  Filled 2014-12-21 (×14): qty 3

## 2014-12-21 MED ORDER — NALOXONE HCL 0.4 MG/ML IJ SOLN
0.4000 mg | Freq: Once | INTRAMUSCULAR | Status: AC
  Administered 2014-12-21: 0.4 mg via INTRAVENOUS

## 2014-12-21 MED ORDER — METHYLPREDNISOLONE SODIUM SUCC 125 MG IJ SOLR
60.0000 mg | Freq: Four times a day (QID) | INTRAMUSCULAR | Status: DC
  Administered 2014-12-22 – 2014-12-23 (×6): 60 mg via INTRAVENOUS
  Filled 2014-12-21: qty 2
  Filled 2014-12-21 (×9): qty 0.96

## 2014-12-21 MED ORDER — LORAZEPAM 2 MG/ML IJ SOLN: 2.0000 mg | INTRAMUSCULAR | Status: DC | PRN

## 2014-12-21 MED ORDER — IPRATROPIUM BROMIDE 0.02 % IN SOLN
0.5000 mg | Freq: Four times a day (QID) | RESPIRATORY_TRACT | Status: DC
  Administered 2014-12-22 – 2014-12-23 (×5): 0.5 mg via RESPIRATORY_TRACT
  Filled 2014-12-21 (×6): qty 2.5

## 2014-12-21 MED ORDER — LORAZEPAM 2 MG/ML IJ SOLN
1.0000 mg | Freq: Once | INTRAMUSCULAR | Status: AC
  Administered 2014-12-21: 1 mg via INTRAVENOUS
  Filled 2014-12-21: qty 1

## 2014-12-21 MED ORDER — DEXTROSE 5 % IV SOLN
500.0000 mg | INTRAVENOUS | Status: DC
  Administered 2014-12-21: 500 mg via INTRAVENOUS
  Filled 2014-12-21 (×2): qty 500

## 2014-12-21 MED ORDER — LEVOFLOXACIN IN D5W 750 MG/150ML IV SOLN
750.0000 mg | Freq: Once | INTRAVENOUS | Status: AC
  Administered 2014-12-21: 750 mg via INTRAVENOUS
  Filled 2014-12-21: qty 150

## 2014-12-21 MED ORDER — LORAZEPAM 2 MG/ML IJ SOLN: 0.0000 mg | Freq: Four times a day (QID) | INTRAMUSCULAR | Status: DC

## 2014-12-21 MED ORDER — SODIUM CHLORIDE 0.9 % IV SOLN
INTRAVENOUS | Status: DC
  Administered 2014-12-22: via INTRAVENOUS

## 2014-12-21 MED ORDER — CEFTRIAXONE SODIUM IN DEXTROSE 20 MG/ML IV SOLN
1.0000 g | INTRAVENOUS | Status: DC
  Administered 2014-12-22: 1 g via INTRAVENOUS
  Filled 2014-12-21 (×3): qty 50
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
## 2014-12-21 NOTE — Progress Notes (Signed)
Attempt to call emergency contact again went straight VM. Melany GuernseyNakeiaRN
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
## 2014-12-21 NOTE — Progress Notes (Signed)
Attempts to call emergency contacts. Left vm stating to call RN with contact number.Will continue to monitor patient care. Melany GuernseyNakeiaRN

## 2014-12-21 NOTE — Progress Notes (Signed)
Called critical values to RN, and relayed information to MD. Ruben MondaySpoke with MD at pt bedside.  ABG    Component Value Date/Time   PHART 7.132* 12/21/2014 2157   PCO2ART 89.4* 12/21/2014 2157   PO2ART 87.0 12/21/2014 2157   HCO3 28.6* 12/21/2014 2157   TCO2 31.4 12/21/2014 2157   ACIDBASEDEF 6.0* 12/18/2014 2159   O2SAT 91.5 12/21/2014 2157   Initiate NIV per MD order.

## 2014-12-21 NOTE — ED Notes (Signed)
Pt c/o CP and resp distress; pt cyanotic and noted low sats and hypoensive

## 2014-12-21 NOTE — ED Provider Notes (Signed)
Date: 12/21/2014 1702  Rate: 103  Rhythm: sinus tachycardia  QRS Axis: normal  Intervals: normal  ST/T Wave abnormalities: nonspecific ST changes  Conduction Disutrbances:none Artifact noted  Patient seen/examined in the Emergency Department in conjunction with Resident Physician Provider  Patient reports cough/SOB Exam : awake/alert, diffuse wheezing bilaterally Plan: will need admission      Joya Gaskinsonald W Karrington Studnicka, MD 12/21/14 1727
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
## 2014-12-22 DIAGNOSIS — F10231 Alcohol dependence with withdrawal delirium: Secondary | ICD-10-CM

## 2014-12-22 DIAGNOSIS — J441 Chronic obstructive pulmonary disease with (acute) exacerbation: Secondary | ICD-10-CM

## 2014-12-22 DIAGNOSIS — R0600 Dyspnea, unspecified: Secondary | ICD-10-CM

## 2014-12-22 LAB — CBC WITH DIFFERENTIAL/PLATELET
Basophils Absolute: 0 10*3/uL (ref 0.0–0.1)
Basophils Relative: 0 % (ref 0–1)
Eosinophils Absolute: 0 10*3/uL (ref 0.0–0.7)
Eosinophils Relative: 0 % (ref 0–5)
HCT: 44.4 % (ref 39.0–52.0)
Hemoglobin: 13.8 g/dL (ref 13.0–17.0)
Lymphocytes Relative: 13 % (ref 12–46)
Lymphs Abs: 0.6 10*3/uL — ABNORMAL LOW (ref 0.7–4.0)
MCH: 30.1 pg (ref 26.0–34.0)
MCHC: 31.1 g/dL (ref 30.0–36.0)
MCV: 96.7 fL (ref 78.0–100.0)
MONOS PCT: 4 % (ref 3–12)
Monocytes Absolute: 0.2 10*3/uL (ref 0.1–1.0)
NEUTROS ABS: 4 10*3/uL (ref 1.7–7.7)
Neutrophils Relative %: 83 % — ABNORMAL HIGH (ref 43–77)
PLATELETS: 125 10*3/uL — AB (ref 150–400)
RBC: 4.59 MIL/uL (ref 4.22–5.81)
RDW: 16.2 % — ABNORMAL HIGH (ref 11.5–15.5)
WBC: 4.9 10*3/uL (ref 4.0–10.5)

## 2014-12-22 LAB — GLUCOSE, CAPILLARY
GLUCOSE-CAPILLARY: 233 mg/dL — AB (ref 70–99)
GLUCOSE-CAPILLARY: 225 mg/dL — AB (ref 70–99)
Glucose-Capillary: 179 mg/dL — ABNORMAL HIGH (ref 70–99)
Glucose-Capillary: 142 mg/dL — ABNORMAL HIGH (ref 70–99)
Glucose-Capillary: 208 mg/dL — ABNORMAL HIGH (ref 70–99)

## 2014-12-22 LAB — BLOOD GAS, ARTERIAL
Acid-base deficit: 0.9 mmol/L (ref 0.0–2.0)
BICARBONATE: 26.7 meq/L — AB (ref 20.0–24.0)
Drawn by: 11249
FIO2: 1 %
Mode: POSITIVE
O2 SAT: 99.4 %
PEEP: 5 cmH2O
PO2 ART: 327 mmHg — AB (ref 80.0–100.0)
PRESSURE CONTROL: 15 cmH2O
Patient temperature: 98.6
RATE: 20 resp/min
TCO2: 29.1 mmol/L (ref 0–100)
pCO2 arterial: 76.6 mmHg (ref 35.0–45.0)
pH, Arterial: 7.169 — CL (ref 7.350–7.450)

## 2014-12-22 LAB — URINALYSIS, ROUTINE W REFLEX MICROSCOPIC
Bilirubin Urine: NEGATIVE
Glucose, UA: NEGATIVE mg/dL
HGB URINE DIPSTICK: NEGATIVE
Ketones, ur: NEGATIVE mg/dL
LEUKOCYTES UA: NEGATIVE
NITRITE: NEGATIVE
PROTEIN: 100 mg/dL — AB
SPECIFIC GRAVITY, URINE: 1.011 (ref 1.005–1.030)
UROBILINOGEN UA: 0.2 mg/dL (ref 0.0–1.0)
pH: 6 (ref 5.0–8.0)

## 2014-12-22 LAB — RAPID URINE DRUG SCREEN, HOSP PERFORMED
AMPHETAMINES: NOT DETECTED
BARBITURATES: NOT DETECTED
Benzodiazepines: POSITIVE — AB
COCAINE: NOT DETECTED
OPIATES: POSITIVE — AB
Tetrahydrocannabinol: NOT DETECTED

## 2014-12-22 LAB — COMPREHENSIVE METABOLIC PANEL
ALBUMIN: 2.6 g/dL — AB (ref 3.5–5.2)
ALT: 64 U/L — AB (ref 0–53)
ANION GAP: 10 (ref 5–15)
AST: 103 U/L — AB (ref 0–37)
Alkaline Phosphatase: 98 U/L (ref 39–117)
BUN: 10 mg/dL (ref 6–23)
CHLORIDE: 96 meq/L (ref 96–112)
CO2: 25 mmol/L (ref 19–32)
CREATININE: 0.74 mg/dL (ref 0.50–1.35)
Calcium: 7.9 mg/dL — ABNORMAL LOW (ref 8.4–10.5)
GFR calc non Af Amer: >90 mL/min (ref >90)
Glucose, Bld: 176 mg/dL — ABNORMAL HIGH (ref 70–99)
Potassium: 4.9 mmol/L (ref 3.5–5.1)
SODIUM: 131 mmol/L — AB (ref 135–145)
Total Bilirubin: 0.5 mg/dL (ref 0.3–1.2)
Total Protein: 7 g/dL (ref 6.0–8.3)

## 2014-12-22 LAB — TROPONIN I
Troponin I: <0.03 ng/mL (ref <0.031)
Troponin I: <0.03 ng/mL (ref <0.031)

## 2014-12-22 LAB — OSMOLALITY, URINE: Osmolality, Ur: 300 mOsm/kg — ABNORMAL LOW (ref 390–1090)

## 2014-12-22 LAB — PROCALCITONIN: Procalcitonin: <0.1 ng/mL

## 2014-12-22 LAB — MAGNESIUM: MAGNESIUM: 1.9 mg/dL (ref 1.5–2.5)

## 2014-12-22 LAB — URINE MICROSCOPIC-ADD ON

## 2014-12-22 LAB — SODIUM, URINE, RANDOM: Sodium, Ur: <10 mmol/L

## 2014-12-22 LAB — HIV ANTIBODY (ROUTINE TESTING W REFLEX): HIV 1&2 Ab, 4th Generation: NONREACTIVE

## 2014-12-22 LAB — PHOSPHORUS: PHOSPHORUS: 6.6 mg/dL — AB (ref 2.3–4.6)

## 2014-12-22 LAB — STREP PNEUMONIAE URINARY ANTIGEN: STREP PNEUMO URINARY ANTIGEN: NEGATIVE

## 2014-12-22 LAB — TSH: TSH: 1.225 u[IU]/mL (ref 0.350–4.500)

## 2014-12-22 LAB — CREATININE, URINE, RANDOM: CREATININE, URINE: 52.06 mg/dL

## 2014-12-22 MED ORDER — INSULIN ASPART 100 UNIT/ML ~~LOC~~ SOLN
0.0000 [IU] | Freq: Three times a day (TID) | SUBCUTANEOUS | Status: DC
  Administered 2014-12-22 (×2): 3 [IU] via SUBCUTANEOUS
  Administered 2014-12-23: 2 [IU] via SUBCUTANEOUS

## 2014-12-22 MED ORDER — DEXMEDETOMIDINE HCL IN NACL 200 MCG/50ML IV SOLN
0.2000 ug/kg/h | INTRAVENOUS | Status: AC
  Administered 2014-12-22: 0.3 ug/kg/h via INTRAVENOUS
  Administered 2014-12-22: 0.4 ug/kg/h via INTRAVENOUS
  Administered 2014-12-23: 0.2 ug/kg/h via INTRAVENOUS
  Filled 2014-12-22 (×3): qty 50

## 2014-12-22 MED ORDER — CETYLPYRIDINIUM CHLORIDE 0.05 % MT LIQD
7.0000 mL | Freq: Two times a day (BID) | OROMUCOSAL | Status: DC
  Administered 2014-12-22 (×2): 7 mL via OROMUCOSAL

## 2014-12-22 MED ORDER — INSULIN ASPART 100 UNIT/ML ~~LOC~~ SOLN
0.0000 [IU] | Freq: Every day | SUBCUTANEOUS | Status: DC
  Administered 2014-12-22: 2 [IU] via SUBCUTANEOUS

## 2014-12-22 MED ORDER — FOLIC ACID 1 MG PO TABS
1.0000 mg | ORAL_TABLET | Freq: Every day | ORAL | Status: DC
  Administered 2014-12-23: 1 mg via ORAL
  Filled 2014-12-22 (×2): qty 1

## 2014-12-22 MED ORDER — ASPIRIN EC 81 MG PO TBEC
81.0000 mg | DELAYED_RELEASE_TABLET | Freq: Every day | ORAL | Status: DC
  Administered 2014-12-23: 81 mg via ORAL
  Filled 2014-12-22: qty 1

## 2014-12-22 MED ORDER — LORAZEPAM 2 MG/ML IJ SOLN
1.0000 mg | INTRAMUSCULAR | Status: DC | PRN
  Administered 2014-12-22 – 2014-12-23 (×8): 2 mg via INTRAVENOUS
  Filled 2014-12-22 (×9): qty 1

## 2014-12-22 MED ORDER — NALOXONE HCL 0.4 MG/ML IJ SOLN
INTRAMUSCULAR | Status: AC
  Filled 2014-12-22: qty 1

## 2014-12-22 MED ORDER — ENOXAPARIN SODIUM 60 MG/0.6ML ~~LOC~~ SOLN
50.0000 mg | SUBCUTANEOUS | Status: DC
  Administered 2014-12-22: 50 mg via SUBCUTANEOUS
  Filled 2014-12-22 (×2): qty 0.6

## 2014-12-22 MED ORDER — CHLORHEXIDINE GLUCONATE 0.12 % MT SOLN
15.0000 mL | Freq: Two times a day (BID) | OROMUCOSAL | Status: DC
  Administered 2014-12-22 (×2): 15 mL via OROMUCOSAL
  Filled 2014-12-22 (×2): qty 15

## 2014-12-22 MED ORDER — SODIUM CHLORIDE 0.9 % IV SOLN: INTRAVENOUS | Status: DC

## 2014-12-22 MED ORDER — FENTANYL CITRATE 0.05 MG/ML IJ SOLN
25.0000 ug | INTRAMUSCULAR | Status: DC | PRN
  Administered 2014-12-22: 25 ug via INTRAVENOUS
  Filled 2014-12-22: qty 2

## 2014-12-22 MED ORDER — INSULIN ASPART 100 UNIT/ML ~~LOC~~ SOLN
2.0000 [IU] | Freq: Once | SUBCUTANEOUS | Status: AC
  Administered 2014-12-22: 2 [IU] via SUBCUTANEOUS

## 2014-12-22 MED ORDER — FOLIC ACID 5 MG/ML IJ SOLN
1.0000 mg | Freq: Every day | INTRAMUSCULAR | Status: DC
  Administered 2014-12-22: 1 mg via INTRAVENOUS
  Filled 2014-12-22 (×2): qty 0.2

## 2014-12-22 NOTE — Progress Notes (Signed)
UR Completed.  [phone]  

## 2014-12-22 NOTE — Progress Notes (Signed)
45cc Precedex wasted in sink.  Witnessed by Kris HartmannLaura Caudle, RN.
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
## 2014-12-22 NOTE — Progress Notes (Signed)
20 mg of Etomidate wasted in sink with Jodene NamMonica Cross, RN.

## 2014-12-22 NOTE — Progress Notes (Signed)
Patient is in no respiratory distress and does not need BIPAP at this time.  RT to continue to monitor.

## 2014-12-23 LAB — BASIC METABOLIC PANEL
ANION GAP: 8 (ref 5–15)
BUN: 12 mg/dL (ref 6–23)
CO2: 26 mmol/L (ref 19–32)
CREATININE: 0.69 mg/dL (ref 0.50–1.35)
Calcium: 8.5 mg/dL (ref 8.4–10.5)
Chloride: 97 mEq/L (ref 96–112)
GFR calc non Af Amer: >90 mL/min (ref >90)
Glucose, Bld: 216 mg/dL — ABNORMAL HIGH (ref 70–99)
Potassium: 4.1 mmol/L (ref 3.5–5.1)
Sodium: 131 mmol/L — ABNORMAL LOW (ref 135–145)

## 2014-12-23 LAB — LEGIONELLA ANTIGEN, URINE

## 2014-12-23 LAB — CBC
HEMATOCRIT: 42.2 % (ref 39.0–52.0)
Hemoglobin: 13.7 g/dL (ref 13.0–17.0)
MCH: 30.8 pg (ref 26.0–34.0)
MCHC: 32.5 g/dL (ref 30.0–36.0)
MCV: 94.8 fL (ref 78.0–100.0)
Platelets: 117 10*3/uL — ABNORMAL LOW (ref 150–400)
RBC: 4.45 MIL/uL (ref 4.22–5.81)
RDW: 15.5 % (ref 11.5–15.5)
WBC: 7.1 10*3/uL (ref 4.0–10.5)

## 2014-12-23 LAB — PROCALCITONIN: Procalcitonin: <0.1 ng/mL

## 2014-12-23 LAB — MAGNESIUM: Magnesium: 1.9 mg/dL (ref 1.5–2.5)

## 2014-12-23 LAB — PHOSPHORUS: Phosphorus: 2.9 mg/dL (ref 2.3–4.6)

## 2014-12-23 NOTE — Progress Notes (Signed)
Patient stated that he was ready to leave.  D/C'd patient PIV's and disconnected patient from monitor.  Patient stood up with no assistance, began to walk to door.  Escorted patient off the unit and gave directions to main lobby.

## 2014-12-23 NOTE — Progress Notes (Signed)
While patient getting dressed, patient HR increased to >160, O2 sat < 85, explained to patient of situation and risk of leaving, patient stated "ill be ok".

## 2014-12-23 NOTE — Progress Notes (Signed)
Patient stated desire to leave AMA, explained risk factors for leaving hospital.  Patient stated he understood, " I have things to get done". Informed charge nurse of patient desire.  Charge nurse approached patient, reiterating risk factors and need for hospital stay.  Explained to patient that I could contact social work to help patient with any issues related to his concerns outside of the hospital. Patient stated he "dont want anyone handling my business".  Informed Dr. Marchelle Gearingamaswamy of situation.  Dr. Sherron MondaySpoke with patient, explained patients need for hospital stay.  Patient insists on leaving hospital.

## 2014-12-27 LAB — GLUCOSE, CAPILLARY: Glucose-Capillary: 230 mg/dL — ABNORMAL HIGH (ref 70–99)

## 2014-12-28 LAB — CULTURE, BLOOD (SINGLE): Culture: NO GROWTH
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
## 2015-01-17 NOTE — Discharge Summary (Signed)
  PT LEFT AMA SUMMARY  Ruben Sutton MRN - [id number] DOB - 08-Sep-1976  Date of Admission - 12/18/2014 Date LEFT AMA: 12/20/2014  Attending Physician:  Cherene Altes  Patient's PCP:  Harvie Junior, MD  Disposition: LEFT AMA  Follow-up Appts:  Not able to be arranged or discussed as pt LEFT AMA  Diagnoses at time pt LEFT AMA: Community acquired PNA Asthma exacerbation HTN EtOH abuse Chest pain  Hospital Course: Patient was admitted on 26th of December with shortness of breath and chest pain and presumed pneumonia as well as asthma exacerbation. He was started on Levaquin. CTA chest was done which was poor quality due to motion but did not show any large PE.  Patient declined repeating imaging at that time. Of note during his admission it was noted that he had slightly elevated troponin up to 0.13. Cardiology consult was called and they felt there was no evidence of ischemia and no further cardiac intervention was needed at that time.  Patient was admitted but on the morning of 28th of December patient became agitated ripped off his telemetry leads and left AMA.  The MD composing this summary never met or actually care for the pt as he left prior to his daily MD visit.    Medication List    Unable to be finalized as pt LEFT AMA  Day of Discharge Wt Readings from Last 3 Encounters:  12/21/14 109 kg (240 lb 4.8 oz)  12/19/14 111.7 kg (246 lb 4.1 oz)  10/13/14 112.946 kg (249 lb)   Temp Readings from Last 3 Encounters:  12/23/14 98.3 F (36.8 C) Oral  12/20/14 97.3 F (36.3 C) Oral  10/13/14 97.9 F (36.6 C)    BP Readings from Last 3 Encounters:  12/23/14 178/103  12/20/14 191/119  10/13/14 150/99   Pulse Readings from Last 3 Encounters:  12/23/14 117  12/20/14 88  10/13/14 91    Physical Exam: Exam not able to be completed at time of d/c as pt LEFT AMA  7:13 PM 01/17/2015  Cherene Altes, MD [facility] Office  [phone] Pager  [phone]  On-Call/Text Page:      [email or web address]      password [employer]

## 2015-09-05 ENCOUNTER — Emergency Department (HOSPITAL_COMMUNITY)
Admission: EM | Admit: 2015-09-05 | Discharge: 2015-09-05 | Discharge status: Against Medical Advice | Payer: Medicaid Other | Attending: Emergency Medicine | Admitting: Emergency Medicine

## 2015-09-05 ENCOUNTER — Encounter (HOSPITAL_COMMUNITY): Payer: Self-pay | Admitting: *Deleted

## 2015-09-05 ENCOUNTER — Emergency Department (HOSPITAL_COMMUNITY): Payer: Medicaid Other

## 2015-09-05 DIAGNOSIS — J441 Chronic obstructive pulmonary disease with (acute) exacerbation: Secondary | ICD-10-CM | POA: Insufficient documentation

## 2015-09-05 DIAGNOSIS — I10 Essential (primary) hypertension: Secondary | ICD-10-CM | POA: Diagnosis not present

## 2015-09-05 DIAGNOSIS — R55 Syncope and collapse: Secondary | ICD-10-CM | POA: Diagnosis not present

## 2015-09-05 DIAGNOSIS — R079 Chest pain, unspecified: Secondary | ICD-10-CM | POA: Insufficient documentation

## 2015-09-05 DIAGNOSIS — Z720 Tobacco use: Secondary | ICD-10-CM | POA: Diagnosis not present

## 2015-09-05 DIAGNOSIS — R0602 Shortness of breath: Secondary | ICD-10-CM | POA: Diagnosis present

## 2015-09-05 LAB — BASIC METABOLIC PANEL
Anion gap: 11 (ref 5–15)
BUN: 6 mg/dL (ref 6–20)
CALCIUM: 8.7 mg/dL — AB (ref 8.9–10.3)
CO2: 26 mmol/L (ref 22–32)
CREATININE: 0.83 mg/dL (ref 0.61–1.24)
Chloride: 100 mmol/L — ABNORMAL LOW (ref 101–111)
GFR calc non Af Amer: >60 mL/min (ref >60)
GLUCOSE: 116 mg/dL — AB (ref 65–99)
Potassium: 3.2 mmol/L — ABNORMAL LOW (ref 3.5–5.1)
Sodium: 137 mmol/L (ref 135–145)

## 2015-09-05 LAB — CBC
HCT: 46.2 % (ref 39.0–52.0)
Hemoglobin: 16.1 g/dL (ref 13.0–17.0)
MCH: 34.4 pg — AB (ref 26.0–34.0)
MCHC: 34.8 g/dL (ref 30.0–36.0)
MCV: 98.7 fL (ref 78.0–100.0)
PLATELETS: 71 10*3/uL — AB (ref 150–400)
RBC: 4.68 MIL/uL (ref 4.22–5.81)
RDW: 15 % (ref 11.5–15.5)
WBC: 8.1 10*3/uL (ref 4.0–10.5)

## 2015-09-05 LAB — I-STAT TROPONIN, ED: TROPONIN I, POC: 0 ng/mL (ref 0.00–0.08)

## 2015-09-05 NOTE — ED Notes (Signed)
Called for pt in lobby x 3 with no answer.
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
## 2015-10-30 ENCOUNTER — Emergency Department (HOSPITAL_COMMUNITY)
Admission: EM | Admit: 2015-10-30 | Discharge: 2015-10-31 | Disposition: A | Discharge status: Home / Self Care | Payer: Medicaid Other | Attending: Emergency Medicine | Admitting: Emergency Medicine

## 2015-10-30 ENCOUNTER — Encounter (HOSPITAL_COMMUNITY): Payer: Self-pay | Admitting: *Deleted

## 2015-10-30 DIAGNOSIS — M1990 Unspecified osteoarthritis, unspecified site: Secondary | ICD-10-CM | POA: Diagnosis not present

## 2015-10-30 DIAGNOSIS — Z79899 Other long term (current) drug therapy: Secondary | ICD-10-CM | POA: Insufficient documentation

## 2015-10-30 DIAGNOSIS — J449 Chronic obstructive pulmonary disease, unspecified: Secondary | ICD-10-CM | POA: Diagnosis not present

## 2015-10-30 DIAGNOSIS — Z8719 Personal history of other diseases of the digestive system: Secondary | ICD-10-CM | POA: Diagnosis not present

## 2015-10-30 DIAGNOSIS — Z951 Presence of aortocoronary bypass graft: Secondary | ICD-10-CM | POA: Diagnosis not present

## 2015-10-30 DIAGNOSIS — F10920 Alcohol use, unspecified with intoxication, uncomplicated: Secondary | ICD-10-CM

## 2015-10-30 DIAGNOSIS — F10120 Alcohol abuse with intoxication, uncomplicated: Secondary | ICD-10-CM | POA: Diagnosis not present

## 2015-10-30 DIAGNOSIS — F1110 Opioid abuse, uncomplicated: Secondary | ICD-10-CM | POA: Diagnosis not present

## 2015-10-30 DIAGNOSIS — Z7982 Long term (current) use of aspirin: Secondary | ICD-10-CM | POA: Insufficient documentation

## 2015-10-30 DIAGNOSIS — I10 Essential (primary) hypertension: Secondary | ICD-10-CM | POA: Insufficient documentation

## 2015-10-30 DIAGNOSIS — F1910 Other psychoactive substance abuse, uncomplicated: Secondary | ICD-10-CM

## 2015-10-30 DIAGNOSIS — Z8701 Personal history of pneumonia (recurrent): Secondary | ICD-10-CM | POA: Insufficient documentation

## 2015-10-30 DIAGNOSIS — Z87828 Personal history of other (healed) physical injury and trauma: Secondary | ICD-10-CM | POA: Diagnosis not present

## 2015-10-30 DIAGNOSIS — Z720 Tobacco use: Secondary | ICD-10-CM | POA: Insufficient documentation
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
## 2015-10-30 NOTE — ED Notes (Signed)
EMS was called out for an unresponsive. Pupils are pinpoint. No evidence of heroine use. Pt was breathing about 4 times a min. ETOH on board. Pt is an alcoholic. Pt required assist with ventilations with a bag valve mask. Pt was given 4 of narcan and came to. Pt states he had given blood earlier today and snorted 3 oxycodones and has never snorted pills before. Pt. States he had about a 5th of liquor

## 2015-10-31 LAB — CBC WITH DIFFERENTIAL/PLATELET
BASOS ABS: 0.1 10*3/uL (ref 0.0–0.1)
BASOS PCT: 1 %
EOS ABS: 0.1 10*3/uL (ref 0.0–0.7)
Eosinophils Relative: 2 %
HEMATOCRIT: 46.6 % (ref 39.0–52.0)
HEMOGLOBIN: 15.6 g/dL (ref 13.0–17.0)
Lymphocytes Relative: 28 %
Lymphs Abs: 2.4 10*3/uL (ref 0.7–4.0)
MCH: 34 pg (ref 26.0–34.0)
MCHC: 33.5 g/dL (ref 30.0–36.0)
MCV: 101.5 fL — ABNORMAL HIGH (ref 78.0–100.0)
Monocytes Absolute: 0.8 10*3/uL (ref 0.1–1.0)
Monocytes Relative: 10 %
NEUTROS ABS: 5.2 10*3/uL (ref 1.7–7.7)
NEUTROS PCT: 61 %
Platelets: 178 10*3/uL (ref 150–400)
RBC: 4.59 MIL/uL (ref 4.22–5.81)
RDW: 15.3 % (ref 11.5–15.5)
WBC: 8.6 10*3/uL (ref 4.0–10.5)

## 2015-10-31 LAB — COMPREHENSIVE METABOLIC PANEL
ALK PHOS: 113 U/L (ref 38–126)
ALT: 38 U/L (ref 17–63)
AST: 64 U/L — AB (ref 15–41)
Albumin: 3.1 g/dL — ABNORMAL LOW (ref 3.5–5.0)
Anion gap: 12 (ref 5–15)
BILIRUBIN TOTAL: 0.5 mg/dL (ref 0.3–1.2)
CO2: 26 mmol/L (ref 22–32)
CREATININE: 0.92 mg/dL (ref 0.61–1.24)
Calcium: 8.4 mg/dL — ABNORMAL LOW (ref 8.9–10.3)
Chloride: 103 mmol/L (ref 101–111)
GFR calc Af Amer: >60 mL/min (ref >60)
Glucose, Bld: 111 mg/dL — ABNORMAL HIGH (ref 65–99)
Potassium: 3.2 mmol/L — ABNORMAL LOW (ref 3.5–5.1)
Sodium: 141 mmol/L (ref 135–145)
TOTAL PROTEIN: 7.7 g/dL (ref 6.5–8.1)

## 2015-10-31 LAB — ACETAMINOPHEN LEVEL

## 2015-10-31 LAB — SALICYLATE LEVEL: Salicylate Lvl: <4 mg/dL (ref 2.8–30.0)

## 2015-10-31 LAB — ETHANOL: Alcohol, Ethyl (B): 383 mg/dL (ref <5)

## 2015-10-31 MED ORDER — LORAZEPAM 1 MG PO TABS
1.0000 mg | ORAL_TABLET | Freq: Once | ORAL | Status: AC
  Administered 2015-10-31: 1 mg via ORAL
  Filled 2015-10-31: qty 1

## 2015-10-31 NOTE — ED Provider Notes (Signed)
Pt ambulatory He has mild tremor, but no other issues He is requesting something for "nerves" One dose of ativan ordered No other signs of acute withdrawal Vital signs appropriate   Zadie Rhineonald Kimala Horne, MD 10/31/15 [phone]

## 2015-10-31 NOTE — Discharge Instructions (Signed)
°Emergency Department Resource Guide °1) Find a Doctor and Pay Out of Pocket °Although you won't have to find out who is covered by your insurance plan, it is a good idea to ask around and get recommendations. You will then need to call the office and see if the doctor you have chosen will accept you as a new patient and what types of options they offer for patients who are self-pay. Some doctors offer discounts or will set up payment plans for their patients who do not have insurance, but you will need to ask so you aren't surprised when you get to your appointment. ° °2) Contact Your Local Health Department °Not all health departments have doctors that can see patients for sick visits, but many do, so it is worth a call to see if yours does. If you don't know where your local health department is, you can check in your phone book. The CDC also has a tool to help you locate your state's health department, and many state websites also have listings of all of their local health departments. ° °3) Find a Walk-in Clinic °If your illness is not likely to be very severe or complicated, you may want to try a walk in clinic. These are popping up all over the country in pharmacies, drugstores, and shopping centers. They're usually staffed by nurse practitioners or physician assistants that have been trained to treat common illnesses and complaints. They're usually fairly quick and inexpensive. However, if you have serious medical issues or chronic medical problems, these are probably not your best option. ° °No Primary Care Doctor: °- Call [facility] at  832-8000 - they can help you locate a primary care doctor that  accepts your insurance, provides certain services, etc. °- Physician Referral Service- 1-800-533-3463 ° °Chronic Pain Problems: °Organization         Address  Phone   Notes  °[facility]  (336) 297-2271 Patients need to be referred by their primary care doctor.  ° °Medication  Assistance: °Organization         Address  Phone   Notes  °[place] [facility] [address] °[place], [place] [address] (336) 641-8030 --Must be a resident of [place] °-- Must have NO insurance coverage whatsoever (no Medicaid/ Medicare, etc.) °-- The pt. MUST have a primary care doctor that directs their care regularly and follows them in the community °  °[facility]  (866) 331-1348   °[employer]  (888) 892-1162   ° °Agencies that provide inexpensive medical care: °Organization         Address  Phone   Notes  °[facility]  (336) 832-8035   °[facility]    (336) 832-7272   °[facility] [address] °[place], [place] [address] (336) 832-4777   °[facility] [address], °[place] (336) 271-4999   °[facility]    (336) 373-0678   °[facility]    (336) 272-1050   °[facility] ° [address], [place] Phone:  (336) 832-4444, Fax:  (336) 832-4440 Hours of Operation:  9 am - 6 pm, M-F.  Also accepts Medicaid/Medicare and self-pay.  °[facility] ° [address], [place] Phone: (336) [phone], Fax: (336) [phone]. Hours of Operation:  8:30 am - 5:30 pm, M-F.  Also accepts Medicaid and self-pay.  °[facility] [place] [address], [place] Phone: (336) 878-6027   °[facility] [address], [place], [place] (336)723-1848, Ext. 123 Mondays & Thursdays: 7-9 AM.  First 15 patients are seen on a first come, first serve basis. °  ° °Medicaid-accepting [place] Providers: ° °Organization         Address  Phone   Notes  °[facility] [address], [place] (336) 641-2100 Also accepts self-pay patients.  °[facility] [address], [place] ° (336) 856-9996   °[facility] [address], [place]  (336) 288-8857   °[facility] [address] [place] [address], [place] (336) 299-7000   °Veita Bland [address], [place]  ° (336) 373-1557 Only accepts [place] Access Medicaid patients after they have their name applied to their card.  ° °Self-Pay (no insurance) in [place]: ° °Organization         Address  Phone   Notes  °Sickle Cell Patients, [facility] [address], [place] (336) 832-1970   °[facility] [address], [place] (336) 832-4400   °[facility] ° [address], [place] (336) 992-4800   °[facility]/Dr. Osei-Bonsu ° [address] [place] [address], [place] or [address], [place] (336) [phone] Phone number for both [place] and [place] locations is the same.  °[facility] [address], [place] (336) 299-0000   °[facility] [address] [place] [address], [place] or [address] (336) 852-7530 °(336) 878-2260   °[facility] [address], [place] (336) 350-1642, phone; (336) 294-5005, fax Sees patients 1st and 3rd Saturday of every month.  Must not qualify for public or private insurance (i.e. Medicaid, Medicare, [place] Health Choice, Veterans' Benefits) • Household income should be no more than 200% of the poverty level •The clinic cannot treat you if you are pregnant or think you are pregnant • Sexually transmitted diseases are not treated at the clinic.  ° ° °Dental Care: °Organization         Address  Phone  Notes  °[place] [facility] [address], [place] (336) 641-6152 Accepts children up to age 21 who are enrolled in Medicaid or [place] Health Choice; pregnant women with a Medicaid card; and children who have applied for Medicaid or [place] Health Choice, but were declined, whose parents can pay a reduced fee at time of service.  °[place]  [facility] [place]  [address], [place] (336) 641-7733 Accepts children up to age 21 who are enrolled in Medicaid or [place] Health Choice; pregnant women with a Medicaid card; and children who have applied for Medicaid or [place] Health Choice, but were declined, whose parents can pay a reduced fee at time of service.  °[facility] ° [address], [place] (336) 641-4533 Patients are seen by appointment only. Walk-ins are not accepted. [facility] will see patients 18 years of age and older. °Monday - Tuesday (8am-5pm) °Most Wednesdays (8:30-5pm) °$30 per visit, cash only  °[facility] ° [address], [place] (336) 641-4533 Patients are seen by appointment only. Walk-ins are not accepted. [facility] will see patients 18 years of age and older. °One   Wednesday Evening (Monthly: Volunteer Based).  $30 per visit, cash only  °[facility]  (919) [phone] for adults; Children under age 4, call Graduate Pediatric Dentistry at ([phone]) [phone]. Children aged 4-14, please call ([phone]) [phone] to request a pediatric application. ° Dental services are provided in all areas of dental care including fillings, crowns and bridges, complete and partial dentures, implants, gum treatment, root canals, and extractions. Preventive care is also provided. Treatment is provided to both adults and children. °Patients are selected via a lottery and there is often a waiting list. °  °[facility] [address], °[place] ° (336) 763-8833 [email or web address] °  °[facility] [address], [place], [place] (336)723-1848, Ext. 123 Second and Fourth Thursday of each month, opens at 6:30 AM; Clinic ends at 9 AM.  Patients are seen on a first-come first-served basis, and a limited number are seen during each clinic.  ° °[facility] ° [address], [place], [place] (336) [phone]    Eligibility Requirements °You must have lived in [place], or [place] counties for at least the last three months. °  You cannot be eligible for state or federal sponsored healthcare insurance, including Veterans Administration, Medicaid, or Medicare. °  You generally cannot be eligible for healthcare insurance through your employer.  °  How to apply: °Eligibility screenings are held every Tuesday and Wednesday afternoon from 1:00 pm until 4:00 pm. You do not need an appointment for the interview!  °[facility] [address], [place], [place] 336-631-2330   °[facility]  336-342-8273   °[facility]  336-703-3100   °[facility]  336-570-6415   ° °Behavioral Health Resources in the Community: °Intensive Outpatient Programs °Organization         Address  Phone  Notes  °[facility] [address], [place], [place] 336-878-6098   °[facility] [address], [place], [place] 336-832-9800   °[facility] [address], [place], [place] ° 336-882-2125   °[facility] [address],  °[place], [place] 1-800-853-5163 or 336-641-4981   °Substance Abuse Resources °Organization         Address  Phone  Notes  °[facility]  336-882-2125   °[facility]  336-784-9470   °[facility]  336-285-9073   °[facility]  336-845-3988   °Residential & Outpatient Substance Abuse Program  1-800-659-3381   °Psychological Services °Organization         Address  Phone  Notes  °[facility]  336- 832-9600   °[facility]  336- 378-7881   °[facility] [address], [place] 1-800-853-5163 or 336-641-4981   ° °Mobile Crisis Teams °Organization         Address  Phone  Notes  °[facility], Mobile Crisis Care Unit  1-877-626-1772   °Assertive °[facility] ° [address]  [place], [place] 336-834-9664   °Sharon DeEsch [address] °[place] 336-554-5454   ° °Self-Help/Support Groups °Organization         Address  Phone             Notes  °[facility]  - variety of support groups  336- 373-1402 Call for more information  °Narcotics Anonymous (NA), [facility] [address], °[place]  2 meetings at this location  ° °  Residential Treatment Programs Organization         Address  Phone  Notes  [facility] [address]    [place]  1-308-657-8469   [facility]  [address] [place] [address], [place], [place] [phone]   [facility] [address] [place], [place] [phone] Admissions: 8am-3pm M-F  [facility] [address],    [place], [place] 474-259-5638   [facility] [address] [place], [place], [place] [phone]   [facility] [address],  [place], [place] [phone]   [facility] - Intensive Outpatient [address], [place], [place] 160-109-3235   [facility] ([facility] [address]  [place], [place] 5-732-202-5427 or (219)543-3830   [facility] ([facility] [address] [place], [place] 517-616-0737 Accepts Medicaid  [facility] [address]  [place] 1-062-694-8546 Substance Abuse/Addiction Treatment   [facility]         Address  Phone  Notes  [facility]  [phone]   Angie Fava, PhD [address] [place] [place], [place]   904-373-3788 or (316)782-6786   [facility]   [address] [place], [place] 762-567-9107   [facility] [address], [place], [place] (414) [phone] Insurance/Medicaid/sponsorship through [facility] [address]                                    [place], [place] [phone] Therapy/tele-psych/case    [facility] [address], [place] (610)[phone]    Dr. Lolly Mustache  [phone]   [facility]  [employer] [facility] Dept. 1) [address], [place] 2) [address] [place] 3)  [address], [place] [phone]  867-541-7127   [facility] Child Abuse Hotline 912-111-1150 or 269 275 1919 (After Hours)        Alcohol Abuse and Nutrition Alcohol abuse is any pattern of alcohol consumption that harms your health, relationships, or work. Alcohol abuse can affect how your body breaks down and absorbs nutrients from food by causing your liver to work abnormally. Additionally, many people who abuse alcohol do not eat enough carbohydrates, protein, fat, vitamins, and minerals. This can cause poor nutrition (malnutrition) and a lack of nutrients (nutrient deficiencies), which can lead to further complications. Nutrients that are commonly lacking (deficient) among people who abuse alcohol include:  Vitamins.  Vitamin A. This is stored in your liver. It is important for your vision, metabolism, and ability to fight off infections (immunity).  B vitamins. These include vitamins such as folate, thiamin, and niacin. These are important in new cell growth and maintenance.  Vitamin C. This plays an important role in iron absorption, wound healing, and immunity.  Vitamin D. This is produced by your liver, but you can also get vitamin D from food. Vitamin D is necessary for your body to absorb and use calcium.  Minerals.  Calcium. This is important for your bones and your heart and blood vessel (cardiovascular) function.  Iron. This is important for blood, muscle, and nervous system functioning.  Magnesium. This plays an important role in muscle and nerve function, and it helps to control blood sugar and blood pressure.  Zinc. This is important for the normal function of your nervous system and digestive  system (gastrointestinal tract). Nutrition is an essential component of therapy for alcohol abuse. Your health care provider or dietitian will work with you to design a plan that can help restore nutrients to your body and prevent potential complications. WHAT IS MY PLAN? Your dietitian may develop a specific diet plan that is based on your condition and any other complications you may have. A diet plan will commonly include:  A balanced diet.  Grains: 6-8 oz per day.  Vegetables: 2-3 cups per day.  Fruits: 1-2 cups per day.  Meat and other protein: 5-6 oz per day.  Dairy: 2-3 cups per day.  Vitamin and mineral supplements. WHAT DO I NEED TO KNOW ABOUT ALCOHOL AND NUTRITION?  Consume foods that are high in antioxidants, such as grapes, berries, nuts, green tea, and dark green and orange vegetables. This can help to counteract some of the stress that is placed on your liver by consuming alcohol.  Avoid food and drinks that are high in fat and sugar. Foods such as sugared soft drinks, salty snack foods, and candy contain empty calories. This means that they lack important nutrients such as protein, fiber, and vitamins.  Eat frequent meals and snacks. Try to eat 5-6 small meals each day.  Eat a variety of fresh fruits and vegetables each day. This will help you get plenty of water, fiber, and vitamins in your diet.  Drink plenty of water and other clear fluids. Try to drink at least 48-64 oz (1.5-2 L) of water per day.  If you are a vegetarian, eat a variety of protein-rich foods. Pair whole grains with plant-based proteins at meals and snacks to obtain the greatest nutrient benefit from your food. For example, eat rice with beans, put peanut butter on whole-grain toast, or eat oatmeal with sunflower seeds.  Soak beans and whole grains overnight before cooking. This can help your body to absorb the nutrients more easily.  Include foods fortified with vitamins and minerals in your diet.  Commonly fortified foods include milk, orange juice, cereal, and bread.  If you are malnourished, your dietitian may recommend a high-protein, high-calorie diet. This may include:  2,000-3,000 calories (kilocalories) per day.  70-100 grams of protein per day.  Your health care provider may recommend a complete nutritional supplement beverage. This can help to restore calories, protein, and vitamins to your body. Depending on your condition, you may be advised to consume this instead of or in addition to meals.  Limit your intake of caffeine. Replace drinks like coffee and black tea with decaffeinated coffee and herbal tea.  Eat a variety of foods that are high in omega fatty acids. These include fish, nuts and seeds, and soybeans. These foods may help your liver to recover and may also stabilize your mood.  Certain medicines may cause changes in your appetite, taste, and weight. Work with your health care provider and dietitian to make any adjustments to your medicines and diet plan.  Include other healthy lifestyle choices in your daily routine.  Be physically active.  Get enough sleep.  Spend time doing activities that you enjoy.  If you are unable to take in enough food and calories by mouth, your health care provider may recommend a feeding tube. This is a tube that passes through your nose and throat, directly into your stomach. Nutritional supplement beverages can be given to you through the feeding tube to help  you get the nutrients you need.  Take vitamin or mineral supplements as recommended by your health care provider. WHAT FOODS CAN I EAT? Grains Enriched pasta. Enriched rice. Fortified whole-grain bread. Fortified whole-grain cereal. Barley. Brown rice. Quinoa. Millet. Vegetables All fresh, frozen, and canned vegetables. Spinach. Kale. Artichoke. Carrots. Winter squash and pumpkin. Sweet potatoes. Broccoli. Cabbage. Cucumbers. Tomatoes. Sweet peppers. Green beans. Peas.  Corn. Fruits All fresh and frozen fruits. Berries. Grapes. Mango. Papaya. Guava. Cherries. Apples. Bananas. Peaches. Plums. Pineapple. Watermelon. Cantaloupe. Oranges. Avocado. Meats and Other Protein Sources Beef liver. Lean beef. Pork. Fresh and canned chicken. Fresh fish. Oysters. Sardines. Canned tuna. Shrimp. Eggs with yolks. Nuts and seeds. Peanut butter. Beans and lentils. Soybeans. Tofu. Dairy Whole, low-fat, and nonfat milk. Whole, low-fat, and nonfat yogurt. Cottage cheese. Sour cream. Hard and soft cheeses. Beverages Water. Herbal tea. Decaffeinated coffee. Decaffeinated green tea. 100% fruit juice. 100% vegetable juice. Instant breakfast shakes. Condiments Ketchup. Mayonnaise. Mustard. Salad dressing. Barbecue sauce. Sweets and Desserts Sugar-free ice cream. Sugar-free pudding. Sugar-free gelatin. Fats and Oils Butter. Vegetable oil, flaxseed oil, olive oil, and walnut oil. Other Complete nutrition shakes. Protein bars. Sugar-free gum. The items listed above may not be a complete list of recommended foods or beverages. Contact your dietitian for more options. WHAT FOODS ARE NOT RECOMMENDED? Grains Sugar-sweetened breakfast cereals. Flavored instant oatmeal. Fried breads. Vegetables Breaded or deep-fried vegetables. Fruits Dried fruit with added sugar. Candied fruit. Canned fruit in syrup. Meats and Other Protein Sources Breaded or deep-fried meats. Dairy Flavored milks. Fried cheese curds or fried cheese sticks. Beverages Alcohol. Sugar-sweetened soft drinks. Sugar-sweetened tea. Caffeinated coffee and tea. Condiments Sugar. Honey. Agave nectar. Molasses. Sweets and Desserts Chocolate. Cake. Cookies. Candy. Other Potato chips. Pretzels. Salted nuts. Candied nuts. The items listed above may not be a complete list of foods and beverages to avoid. Contact your dietitian for more information.   This information is not intended to replace advice given to you by your  health care provider. Make sure you discuss any questions you have with your health care provider.   Document Released: 10/04/2005 Document Revised: 12/31/2014 Document Reviewed: 07/13/2014 Elsevier Interactive Patient Education [employer]

## 2016-10-23 LAB — URINE CULTURE
